# Patient Record
Sex: Male | Born: 1960
Health system: Southern US, Community
[De-identification: ages and names within clinical notes are randomized; demographics above are authoritative.]

## PROBLEM LIST (undated history)

## (undated) DIAGNOSIS — E119 Type 2 diabetes mellitus without complications: Secondary | ICD-10-CM

## (undated) DIAGNOSIS — Z973 Presence of spectacles and contact lenses: Secondary | ICD-10-CM

## (undated) DIAGNOSIS — N189 Chronic kidney disease, unspecified: Secondary | ICD-10-CM

## (undated) DIAGNOSIS — I2510 Atherosclerotic heart disease of native coronary artery without angina pectoris: Secondary | ICD-10-CM

## (undated) DIAGNOSIS — E7800 Pure hypercholesterolemia, unspecified: Secondary | ICD-10-CM

## (undated) DIAGNOSIS — R351 Nocturia: Secondary | ICD-10-CM

## (undated) DIAGNOSIS — E782 Mixed hyperlipidemia: Secondary | ICD-10-CM

## (undated) DIAGNOSIS — K219 Gastro-esophageal reflux disease without esophagitis: Secondary | ICD-10-CM

## (undated) DIAGNOSIS — I10 Essential (primary) hypertension: Secondary | ICD-10-CM

## (undated) HISTORY — DX: Pure hypercholesterolemia, unspecified: E78.00

---

## 1998-02-13 HISTORY — PX: NASAL SINUS SURGERY: SHX719

## 1998-05-26 ENCOUNTER — Ambulatory Visit (HOSPITAL_BASED_OUTPATIENT_CLINIC_OR_DEPARTMENT_OTHER): Admission: RE | Admit: 1998-05-26 | Discharge: 1998-05-26 | Payer: Self-pay | Admitting: Otolaryngology

## 2010-08-13 ENCOUNTER — Emergency Department (HOSPITAL_BASED_OUTPATIENT_CLINIC_OR_DEPARTMENT_OTHER)
Admission: EM | Admit: 2010-08-13 | Discharge: 2010-08-13 | Disposition: A | Discharge status: Home / Self Care | Payer: BC Managed Care – PPO | Attending: Emergency Medicine | Admitting: Emergency Medicine

## 2010-08-13 ENCOUNTER — Emergency Department (INDEPENDENT_AMBULATORY_CARE_PROVIDER_SITE_OTHER): Payer: BC Managed Care – PPO

## 2010-08-13 DIAGNOSIS — N201 Calculus of ureter: Secondary | ICD-10-CM

## 2010-08-13 DIAGNOSIS — N1330 Unspecified hydronephrosis: Secondary | ICD-10-CM | POA: Insufficient documentation

## 2010-08-13 DIAGNOSIS — K7689 Other specified diseases of liver: Secondary | ICD-10-CM

## 2010-08-13 DIAGNOSIS — R109 Unspecified abdominal pain: Secondary | ICD-10-CM

## 2010-08-13 DIAGNOSIS — K449 Diaphragmatic hernia without obstruction or gangrene: Secondary | ICD-10-CM | POA: Insufficient documentation

## 2010-08-13 LAB — URINALYSIS, ROUTINE W REFLEX MICROSCOPIC
Ketones, ur: NEGATIVE mg/dL
Leukocytes, UA: NEGATIVE
Nitrite: NEGATIVE
Urobilinogen, UA: 0.2 mg/dL (ref 0.0–1.0)
pH: 6 (ref 5.0–8.0)

## 2011-02-09 ENCOUNTER — Encounter (HOSPITAL_COMMUNITY): Payer: Self-pay | Admitting: *Deleted

## 2011-02-10 ENCOUNTER — Encounter (HOSPITAL_COMMUNITY)
Admission: RE | Disposition: A | Discharge status: Home / Self Care | Payer: Self-pay | Source: Ambulatory Visit | Attending: Gastroenterology

## 2011-02-10 ENCOUNTER — Ambulatory Visit (HOSPITAL_COMMUNITY): Payer: BC Managed Care – PPO

## 2011-02-10 ENCOUNTER — Other Ambulatory Visit (HOSPITAL_COMMUNITY): Payer: Self-pay | Admitting: Gastroenterology

## 2011-02-10 ENCOUNTER — Encounter (HOSPITAL_COMMUNITY): Payer: Self-pay

## 2011-02-10 ENCOUNTER — Ambulatory Visit (HOSPITAL_COMMUNITY)
Admission: RE | Admit: 2011-02-10 | Discharge: 2011-02-10 | Disposition: A | Discharge status: Home / Self Care | Payer: BC Managed Care – PPO | Source: Ambulatory Visit | Attending: Gastroenterology | Admitting: Gastroenterology

## 2011-02-10 DIAGNOSIS — K2960 Other gastritis without bleeding: Secondary | ICD-10-CM | POA: Insufficient documentation

## 2011-02-10 DIAGNOSIS — K449 Diaphragmatic hernia without obstruction or gangrene: Secondary | ICD-10-CM | POA: Insufficient documentation

## 2011-02-10 DIAGNOSIS — R1310 Dysphagia, unspecified: Secondary | ICD-10-CM | POA: Insufficient documentation

## 2011-02-10 HISTORY — PX: ESOPHAGOGASTRODUODENOSCOPY: SHX5428

## 2011-02-10 HISTORY — PX: SAVORY DILATION: SHX5439

## 2011-02-10 HISTORY — DX: Chronic kidney disease, unspecified: N18.9

## 2011-02-10 SURGERY — EGD (ESOPHAGOGASTRODUODENOSCOPY)
Anesthesia: Moderate Sedation

## 2011-02-10 MED ORDER — FENTANYL NICU IV SYRINGE 50 MCG/ML
INJECTION | INTRAMUSCULAR | Status: DC | PRN
  Administered 2011-02-10 (×2): 25 ug via INTRAVENOUS

## 2011-02-10 MED ORDER — FENTANYL CITRATE 0.05 MG/ML IJ SOLN
INTRAMUSCULAR | Status: AC
  Filled 2011-02-10: qty 4

## 2011-02-10 MED ORDER — SODIUM CHLORIDE 0.9 % IV SOLN
Freq: Once | INTRAVENOUS | Status: AC
  Administered 2011-02-10: 500 mL via INTRAVENOUS

## 2011-02-10 MED ORDER — MIDAZOLAM HCL 10 MG/2ML IJ SOLN
INTRAMUSCULAR | Status: AC
  Filled 2011-02-10: qty 4

## 2011-02-10 MED ORDER — BUTAMBEN-TETRACAINE-BENZOCAINE 2-2-14 % EX AERO
INHALATION_SPRAY | CUTANEOUS | Status: DC | PRN
  Administered 2011-02-10: 2 via TOPICAL

## 2011-02-10 MED ORDER — DIPHENHYDRAMINE HCL 50 MG/ML IJ SOLN
INTRAMUSCULAR | Status: AC
  Filled 2011-02-10: qty 1

## 2011-02-10 MED ORDER — MIDAZOLAM HCL 10 MG/2ML IJ SOLN
INTRAMUSCULAR | Status: DC | PRN
  Administered 2011-02-10 (×3): 2 mg via INTRAVENOUS

## 2011-02-10 NOTE — Op Note (Signed)
[facility] [address] [place], [place]  [address]  ENDOSCOPY PROCEDURE REPORT  PATIENT:  Zachary Hamilton, Zachary Hamilton  MR#:  [id number] BIRTHDATE:  08-18-1960, 50 yrs. old  GENDER:  male  ENDOSCOPIST:  Vida Rigger, MD Referred by:       c.  Miguel Aschoff, M.D.  PROCEDURE DATE:  02/10/2011 PROCEDURE:  EGD with dilatation over guidewire ASA CLASS:  Class II INDICATIONS:  dysphasia  MEDICATIONS:  50 mcg fentanyl 6 mg Versed TOPICAL ANESTHETIC: Used  DESCRIPTION OF PROCEDURE:   After the risks benefits and alternatives of the procedure were thoroughly explained, informed consent was obtained.  The EG-[id number] ([id number]) endoscope was introduced through the mouth and advanced to the second portion of the duodenum. The instrument was slowly withdrawn as the mucosa was fully examined. See findings below we then readvanced to the antrum the Savary wire was advanced and the customary J. loop was confirmed in the antrum under both endoscopic and fluoroscopy guidance. Under fluoroscopy guidance the scope was removed making sure to keep the wire in the proper location in the antrum and a Savary 16 mm dilator was advanced and confirmed in the proper position in the stomach there was no resistance in passing the dilator the wire was withdrawn back into the dilator and both were removed in tandem. There was no heme on the dilator the patient tolerated the procedure well there was no obvious immediate complication <<PROCEDUREIMAGES>>  FINDINGS: 1. Small hiatal hernia with minimal widely patent fibrous ring 2. Mild antritis 3. Otherwise within normal limits EGD Therapy: Savary dilatation under fluoroscopy to 16 mm only  COMPLICATIONS: None  ENDOSCOPIC IMPRESSION: Above  RECOMMENDATIONS: Continue pump inhibitors call when necessary followup when necessary or in 2 months  REPEAT EXAM:  As needed  ______________________________ Vida Rigger, MD  CC:  Gildardo Cranker MD  n. Rosalie DoctorVida Rigger at 02/10/2011 09:56 AM  Janace Litten, [phone]

## 2011-02-13 ENCOUNTER — Encounter (HOSPITAL_COMMUNITY): Payer: Self-pay | Admitting: Gastroenterology

## 2011-02-14 DIAGNOSIS — I2510 Atherosclerotic heart disease of native coronary artery without angina pectoris: Secondary | ICD-10-CM

## 2011-02-14 HISTORY — DX: Atherosclerotic heart disease of native coronary artery without angina pectoris: I25.10

## 2011-08-31 ENCOUNTER — Encounter (HOSPITAL_BASED_OUTPATIENT_CLINIC_OR_DEPARTMENT_OTHER): Payer: Self-pay | Admitting: *Deleted

## 2011-08-31 ENCOUNTER — Observation Stay (HOSPITAL_BASED_OUTPATIENT_CLINIC_OR_DEPARTMENT_OTHER)
Admission: EM | Admit: 2011-08-31 | Discharge: 2011-09-01 | Disposition: A | Discharge status: Home / Self Care | Payer: BC Managed Care – PPO | Attending: Cardiology | Admitting: Cardiology

## 2011-08-31 ENCOUNTER — Emergency Department (HOSPITAL_BASED_OUTPATIENT_CLINIC_OR_DEPARTMENT_OTHER): Payer: BC Managed Care – PPO

## 2011-08-31 DIAGNOSIS — N189 Chronic kidney disease, unspecified: Secondary | ICD-10-CM | POA: Insufficient documentation

## 2011-08-31 DIAGNOSIS — E785 Hyperlipidemia, unspecified: Secondary | ICD-10-CM | POA: Diagnosis present

## 2011-08-31 DIAGNOSIS — R0789 Other chest pain: Principal | ICD-10-CM | POA: Insufficient documentation

## 2011-08-31 DIAGNOSIS — Z8249 Family history of ischemic heart disease and other diseases of the circulatory system: Secondary | ICD-10-CM

## 2011-08-31 DIAGNOSIS — K219 Gastro-esophageal reflux disease without esophagitis: Secondary | ICD-10-CM | POA: Diagnosis present

## 2011-08-31 DIAGNOSIS — R079 Chest pain, unspecified: Secondary | ICD-10-CM | POA: Diagnosis present

## 2011-08-31 DIAGNOSIS — R1310 Dysphagia, unspecified: Secondary | ICD-10-CM | POA: Insufficient documentation

## 2011-08-31 LAB — COMPREHENSIVE METABOLIC PANEL
AST: 16 U/L (ref 0–37)
Albumin: 4.5 g/dL (ref 3.5–5.2)
Alkaline Phosphatase: 37 U/L — ABNORMAL LOW (ref 39–117)
Chloride: 99 mEq/L (ref 96–112)
Potassium: 3.6 mEq/L (ref 3.5–5.1)
Sodium: 136 mEq/L (ref 135–145)
Total Bilirubin: 0.3 mg/dL (ref 0.3–1.2)
Total Protein: 8.3 g/dL (ref 6.0–8.3)

## 2011-08-31 LAB — CBC WITH DIFFERENTIAL/PLATELET
Basophils Absolute: 0 10*3/uL (ref 0.0–0.1)
Basophils Relative: 0 % (ref 0–1)
Hemoglobin: 14.5 g/dL (ref 13.0–17.0)
MCHC: 34.6 g/dL (ref 30.0–36.0)
Neutro Abs: 5.4 10*3/uL (ref 1.7–7.7)
Neutrophils Relative %: 72 % (ref 43–77)
Platelets: 248 10*3/uL (ref 150–400)
RDW: 13.7 % (ref 11.5–15.5)

## 2011-08-31 MED ORDER — NITROGLYCERIN 0.4 MG SL SUBL
0.4000 mg | SUBLINGUAL_TABLET | SUBLINGUAL | Status: AC | PRN
  Administered 2011-08-31 (×3): 0.4 mg via SUBLINGUAL
  Filled 2011-08-31: qty 25

## 2011-08-31 NOTE — ED Notes (Signed)
Sharp left chest pain since last night. Dizziness.

## 2011-08-31 NOTE — ED Provider Notes (Signed)
History     CSN: [id number]  Arrival date & time 08/31/11  1504   First MD Initiated Contact with Patient 08/31/11 1615    Room 2, hp  Chief Complaint  Patient presents with  . Chest Pain    (Consider location/radiation/quality/duration/timing/severity/associated sxs/prior treatment) HPI Patient with chest pain across front of chest yesterday described as tight all day yesterday at 4-5/10 still present when he went to sleep.  Patient awoke and had sharper pain in left chest.  Not had any similar pain in past.  Some lightheadedness and questionably sweaty yesterday- air conditioner was out.  Today pain coming and going without inciting factors, does not occur with exertion.  Currently present at 5-6/10.  Took aspirin today.  History of chronic heart burn and required dilation.  PMD Duane Lope.   Past Medical History  Diagnosis Date  . Dysphagia   . Chronic kidney disease     Past Surgical History  Procedure Date  . Nasal sinus surgery 2000  . Esophagogastroduodenoscopy 02/10/2011    Procedure: ESOPHAGOGASTRODUODENOSCOPY (EGD);  Surgeon: Petra Kuba, MD;  Location: [facility];  Service: Endoscopy;  Laterality: N/A;  . Savory dilation 02/10/2011    Procedure: SAVORY DILATION;  Surgeon: Petra Kuba, MD;  Location: [facility];  Service: Endoscopy;  Laterality: N/A;    Family History  Problem Relation Age of Onset  . Anesthesia problems Neg Hx     History  Substance Use Topics  . Smoking status: Never Smoker   . Smokeless tobacco: Never Used  . Alcohol Use: 0.6 oz/week    1 Cans of beer per week   Father with mi-first at age 40. Patient saw cardiologist 8-9 years ago and had stress test.    Review of Systems  All other systems reviewed and are negative.    Allergies  Review of patient's allergies indicates no known allergies.  Home Medications  No current outpatient prescriptions on file.  BP 172/89  Pulse 78  Temp 98 F (36.7 C) (Oral)  Resp 22  SpO2  100%  Physical Exam  Nursing note and vitals reviewed. Constitutional: He is oriented to person, place, and time. He appears well-developed and well-nourished.  HENT:  Head: Normocephalic and atraumatic.  Right Ear: External ear normal.  Left Ear: External ear normal.  Nose: Nose normal.  Mouth/Throat: Oropharynx is clear and moist.  Eyes: Conjunctivae and EOM are normal. Pupils are equal, round, and reactive to light.  Neck: Normal range of motion. Neck supple.  Cardiovascular: Normal rate, regular rhythm, normal heart sounds and intact distal pulses.   Pulmonary/Chest: Effort normal and breath sounds normal.  Abdominal: Soft. Bowel sounds are normal.  Musculoskeletal: Normal range of motion.  Neurological: He is alert and oriented to person, place, and time. He has normal reflexes.  Skin: Skin is warm and dry.  Psychiatric: He has a normal mood and affect. His behavior is normal. Thought content normal.    ED Course  Procedures (including critical care time)  Labs Reviewed - No data to display No results found.   No diagnosis found.  Date: 08/31/2011  Rate: 73  Rhythm: normal sinus rhythm  QRS Axis: normal  Intervals: normal  ST/T Wave abnormalities: nonspecific ST changes  Conduction Disutrbances:poor r wave progression  Narrative Interpretation:   Old EKG Reviewed: none available    MDM  Patient pain free after nitro x 2, then recurred and nitro given with pain resolution.  Patient received aspirin.  Patient's care discussed  with Dr. Tresa Endo and patient to be admitted to cardiology to telemetry bed.         Hilario Quarry, MD 08/31/11 2018

## 2011-09-01 DIAGNOSIS — K219 Gastro-esophageal reflux disease without esophagitis: Secondary | ICD-10-CM | POA: Diagnosis present

## 2011-09-01 DIAGNOSIS — R079 Chest pain, unspecified: Secondary | ICD-10-CM

## 2011-09-01 DIAGNOSIS — E785 Hyperlipidemia, unspecified: Secondary | ICD-10-CM | POA: Diagnosis present

## 2011-09-01 DIAGNOSIS — Z8249 Family history of ischemic heart disease and other diseases of the circulatory system: Secondary | ICD-10-CM

## 2011-09-01 LAB — PRO B NATRIURETIC PEPTIDE: Pro B Natriuretic peptide (BNP): 23.5 pg/mL (ref 0–125)

## 2011-09-01 LAB — CBC
HCT: 41 % (ref 39.0–52.0)
Platelets: 211 10*3/uL (ref 150–400)
RDW: 14 % (ref 11.5–15.5)
WBC: 6.7 10*3/uL (ref 4.0–10.5)

## 2011-09-01 LAB — CARDIAC PANEL(CRET KIN+CKTOT+MB+TROPI)
CK, MB: 1.5 ng/mL (ref 0.3–4.0)
Relative Index: INVALID (ref 0.0–2.5)
Total CK: 87 U/L (ref 7–232)
Total CK: 86 U/L (ref 7–232)
Troponin I: <0.3 ng/mL (ref <0.30)
Troponin I: <0.3 ng/mL

## 2011-09-01 LAB — LIPID PANEL
HDL: 46 mg/dL (ref >39)
LDL Cholesterol: 151 mg/dL — ABNORMAL HIGH (ref 0–99)
Total CHOL/HDL Ratio: 4.7 RATIO

## 2011-09-01 LAB — CREATININE, SERUM
GFR calc Af Amer: >90 mL/min (ref >90)
GFR calc non Af Amer: >90 mL/min (ref >90)

## 2011-09-01 LAB — PROTIME-INR: INR: 1.03 (ref 0.00–1.49)

## 2011-09-01 LAB — APTT: aPTT: 35 s (ref 24–37)

## 2011-09-01 LAB — HEMOGLOBIN A1C
Hgb A1c MFr Bld: 5.6 %
Mean Plasma Glucose: 114 mg/dL

## 2011-09-01 MED ORDER — ASPIRIN 81 MG PO TBEC: 81.0000 mg | DELAYED_RELEASE_TABLET | Freq: Every day | ORAL | Status: AC

## 2011-09-01 MED ORDER — HEPARIN SODIUM (PORCINE) 5000 UNIT/ML IJ SOLN
5000.0000 [IU] | Freq: Three times a day (TID) | INTRAMUSCULAR | Status: DC
  Filled 2011-09-01: qty 1

## 2011-09-01 MED ORDER — SIMVASTATIN 40 MG PO TABS
40.0000 mg | ORAL_TABLET | Freq: Every day | ORAL | Status: DC
  Filled 2011-09-01 (×2): qty 1

## 2011-09-01 MED ORDER — ACETAMINOPHEN 325 MG PO TABS: 650.0000 mg | ORAL_TABLET | ORAL | Status: DC | PRN

## 2011-09-01 MED ORDER — SODIUM CHLORIDE 0.9 % IJ SOLN: 3.0000 mL | Freq: Two times a day (BID) | INTRAMUSCULAR | Status: DC

## 2011-09-01 MED ORDER — ONDANSETRON HCL 4 MG/2ML IJ SOLN: 4.0000 mg | Freq: Four times a day (QID) | INTRAMUSCULAR | Status: DC | PRN

## 2011-09-01 MED ORDER — PANTOPRAZOLE SODIUM 40 MG PO TBEC: 40.0000 mg | DELAYED_RELEASE_TABLET | Freq: Every day | ORAL | Status: DC

## 2011-09-01 MED ORDER — NITROGLYCERIN 0.4 MG SL SUBL: 0.4000 mg | SUBLINGUAL_TABLET | SUBLINGUAL | Status: DC | PRN

## 2011-09-01 MED ORDER — SODIUM CHLORIDE 0.9 % IJ SOLN: 3.0000 mL | INTRAMUSCULAR | Status: DC | PRN

## 2011-09-01 MED ORDER — SODIUM CHLORIDE 0.9 % IV SOLN: 250.0000 mL | INTRAVENOUS | Status: DC | PRN

## 2011-09-01 MED ORDER — ASPIRIN EC 81 MG PO TBEC: 81.0000 mg | DELAYED_RELEASE_TABLET | Freq: Every day | ORAL | Status: DC

## 2011-09-01 MED ORDER — SIMVASTATIN 40 MG PO TABS: 40.0000 mg | ORAL_TABLET | Freq: Every day | ORAL | Status: DC

## 2011-09-01 NOTE — Discharge Summary (Signed)
Patient ID: Zachary Hamilton. MRN: [id number] DOB/AGE: 09-06-1960 51 y.o.  Admit date: 08/31/2011 Discharge date: 09/01/2011  Primary Discharge Diagnosis: Atypical chest pain Secondary Discharge Diagnosis: Hyperlipidemia, GERD, strong family history of early coronary disease, father with myocardial infarction at age 49, GERD, prior esophageal dilatation 12/12  Significant Diagnostic Studies: EKG shows sinus rhythm/sinus bradycardia with nonspecific T-wave flattening in lead 3.  Hospital Course: 51 year old male with history of hyperlipidemia, GERD, early family history of CAD who presented to the hospital with sharp left-sided chest discomfort that was off and on most of the day yesterday concerning to him. He's had some recent friend/coworkers have had coronary disease/myocardial infarctions. His father had his first heart attack at age 48 died age 41 but was a heavy smoker. He has not experienced any exertional angina. His chest pain was occurring at rest and did not appear to be exacerbated by any activity. He has not had any recent cough, fevers, orthopnea, rashes. This morning he states that he feels better and has not had an episode of pain. He's had GI disease in the past with GERD, esophageal dilatation by Dr. Ewing Schlein.  With his family history, atypical chest pain I would like to pursue stress testing. We will set this up as an outpatient. I have given him my card to call me if any symptoms worsen. LDL cholesterol 151, HDL 46, triglycerides 101. Troponin normal, hemoglobin 14.    Discharge Exam: Blood pressure 131/80, pulse 54, temperature 98.7 F (37.1 C), temperature source Tympanic, resp. rate 18, height 6' (1.829 m), weight 100.5 kg (221 lb 9 oz), SpO2 97.00%.    General: Alert and oriented x3 in no acute distress Cardiovascular: Regular rate and rhythm, no rubs appreciated, no murmurs Lungs: Clear to auscultation bilaterally Abdomen: Soft nontender normal active bowel sounds. No  epigastric tenderness. Extremities: No clubbing cyanosis or edema, normal distal pulses, ambulating well  Labs:   Lab Results  Component Value Date   WBC 6.7 09/01/2011   HGB 14.0 09/01/2011   HCT 41.0 09/01/2011   MCV 87.0 09/01/2011   PLT 211 09/01/2011    Lab 09/01/11 0038 08/31/11 1627  NA -- 136  K -- 3.6  CL -- 99  CO2 -- 25  BUN -- 20  CREATININE 0.86 --  CALCIUM -- 9.4  PROT -- 8.3  BILITOT -- 0.3  ALKPHOS -- 37*  ALT -- 19  AST -- 16  GLUCOSE -- 107*   Lab Results  Component Value Date   CKTOTAL 86 09/01/2011   CKMB 1.5 09/01/2011   TROPONINI <0.30 09/01/2011    Lab Results  Component Value Date   CHOL 217* 09/01/2011   Lab Results  Component Value Date   HDL 46 09/01/2011   Lab Results  Component Value Date   LDLCALC 151* 09/01/2011   Lab Results  Component Value Date   TRIG 101 09/01/2011   Lab Results  Component Value Date   CHOLHDL 4.7 09/01/2011   No results found for this basename: LDLDIRECT     FOLLOW UP PLANS AND APPOINTMENTS Discharge Orders    Future Orders Please Complete By Expires   Diet - low sodium heart healthy      Increase activity slowly        Medication List  As of 09/01/2011  8:35 AM   TAKE these medications         aspirin 81 MG EC tablet   Take 1 tablet (81 mg total) by mouth daily.  nitroGLYCERIN 0.4 MG SL tablet   Commonly known as: NITROSTAT   Place 1 tablet (0.4 mg total) under the tongue every 5 (five) minutes x 3 doses as needed for chest pain.      simvastatin 40 MG tablet   Commonly known as: ZOCOR   Take 1 tablet (40 mg total) by mouth daily at 6 PM.           Follow-up Information    Follow up with Donato Schultz, MD on 09/05/2011. (Stress test Tues (office will call))    Contact information:   [address] [place] [address] [phone]          BRING ALL MEDICATIONS WITH YOU TO FOLLOW UP APPOINTMENTS  Time spent with patient to include physician time: 35 min  discussion with patient, review of medical records, review of history, coordination of stress test with office. SignedDonato Schultz 09/01/2011, 8:35 AM

## 2011-09-01 NOTE — H&P (Signed)
Zachary Worlds. is an 51 y.o. male.   Chief Complaint: chest pain Previously seen by [facility] - unknown physician name  HPI:51 yo man with PMH of HLD and GERD, never tobacco user with esophageal dilatation 12/12 who has had 1-2 days of intermittent chest pain. He tells me the pain woke him up from sleep 1-2 mornings ago and has come and go typically lasting several minutes before going away on its own. No associated neck/jaw pain or arm numbness/weakness. No association to exertion. 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 His father had 5 MIs from age 38 to 45 finally dying after his 6th MI. He tells me he did not have chest pain with his dysphagia in December. Zachary Hamilton can walk in excess of 2 miles and several flights of stairs without difficulty. No PND, no orthopnea. No DOE and no SOB.   Past Medical History  Diagnosis Date  . Dysphagia   . Chronic kidney disease     Past Surgical History  Procedure Date  . Nasal sinus surgery 2000  . Esophagogastroduodenoscopy 02/10/2011    Procedure: ESOPHAGOGASTRODUODENOSCOPY (EGD);  Surgeon: Marc E Magod, MD;  Location: [facility];  Service: Endoscopy;  Laterality: N/A;  . Savory dilation 02/10/2011    Procedure: SAVORY DILATION;  Surgeon: Marc E Magod, MD;  Location: [facility];  Service: Endoscopy;  Laterality: N/A;    Family History  Problem Relation Age of Onset  . Anesthesia problems Neg Hx    Social History:  reports that he has never smoked. He has never used smokeless tobacco. He reports that he drinks about .6 ounces of alcohol per week. He reports that he does not use illicit drugs. 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 Negative for fever, chills and malaise/fatigue.       40  lb intentional weight loss  HENT: Negative for hearing loss and neck pain.   Eyes: Negative for blurred vision and double vision.  Respiratory: Negative for cough, hemoptysis, sputum production and shortness of breath.   Cardiovascular: Positive for chest pain. Negative for palpitations, orthopnea, claudication, leg swelling and PND.  Gastrointestinal: Negative for heartburn, nausea, vomiting and abdominal pain.  Genitourinary: Negative for dysuria, urgency and frequency.  Musculoskeletal: Negative for myalgias and back pain.  Skin: Negative for itching and rash.  Neurological: Positive for dizziness. Negative for tingling, tremors, sensory change and headaches.  Endo/Heme/Allergies: Negative for environmental allergies. Does not bruise/bleed easily.  Psychiatric/Behavioral: Negative for depression, suicidal ideas and substance abuse.  All other systems reviewed and are negative.    Blood pressure 131/81, pulse 60, temperature 98 F (36.7 C), temperature source Oral, resp. rate 18, height 6' (1.829 m), weight 100.5 kg (221 lb 9 oz), SpO2 96.00%. Physical Exam  Nursing note and vitals reviewed. Constitutional: He is oriented to person, place, and time. He appears well-developed and well-nourished. No distress.       Pleasant well groomed man in NAD, fully conversant  HENT:  Head: Normocephalic and atraumatic.  Nose: Nose normal.  Mouth/Throat: Oropharynx is clear and moist. No oropharyngeal exudate.  Eyes: Conjunctivae and EOM are normal. Pupils are equal, round,  and reactive to light. No scleral icterus.  Neck: Normal range of motion. Neck supple. No JVD present. No tracheal deviation present. No thyromegaly present.  Cardiovascular: Normal rate, regular rhythm, normal heart sounds and intact distal pulses.  Exam reveals no gallop and no friction rub.   No murmur  heard. Respiratory: Effort normal and breath sounds normal. No respiratory distress. He has no wheezes. He has no rales.  GI: Soft. Bowel sounds are normal. He exhibits no distension. There is no tenderness. There is no rebound.  Musculoskeletal: Normal range of motion. He exhibits no edema and no tenderness.  Neurological: He is alert and oriented to person, place, and time. No cranial nerve deficit.  Skin: Skin is warm and dry. No rash noted. He is not diaphoretic. No erythema.  Psychiatric: He has a normal mood and affect. His behavior is normal.    Labs reviewed;  H/h 14/41, plt 248, K 3.6, na 136, bun/cr 20/0.8, troponin negative <0.3, LFTS unrevealing EKG reviewed; NSR, poor R wave progression anteriorly across precordium Chest x-ray reviewed; no overt process Assessment/Plan Problem List Chest Pain GERD Hyperlipidemia   51 yo man never tobacco user with strong family history first degree relative, negative exercise treadmill 10 years ago here with atypical chest pain. Differential is musculoskeletal, pericarditis, pleurisy, PNA, GERD and ACS. Given no exercise limitations and chest pain unrelated to exertion, EKG without signs of pericarditis and no overt signs of infection, I favor a diagnosis GERD or musculoskeletal pain. However, given significant family history, I prefer stress testing tomorrow AM if his biomarkers remain negative with Stress Echo or MPI.  - Telemetry, trend troponins - hba1c, thyroid studies, lipid panel - restart statin - simva 40 mg initially pending lipid level - defer Stress imaging order placement to AM - aspirin 81 mg daily - ambulatory so no VTE prophylaxis at this time - restart PPI  - continued diet and exercise counseling provided/reinforced  Meridith Romick 09/01/2011, 1:30 AM

## 2011-09-01 NOTE — Progress Notes (Signed)
Card  CARDIAC REHAB PHASE I   PRE:  Rate/Rhythm: 64 SR  BP:  Supine: 20/64  Sitting:   Standing:    SaO2: 98 RA  MODE:  Ambulation: 1240 ft   POST:  Rate/Rhythem: 71  BP:  Supine:   Sitting: 132/64  Standing:    SaO2: 99 RA 0945-1045  Tolerated ambulation well without c/o of cp or SOB. VS stable. Completed discharge education with pt and wife. Pt voices understanding.  Beatrix Fetters

## 2011-09-02 ENCOUNTER — Encounter (HOSPITAL_COMMUNITY): Payer: Self-pay | Admitting: Emergency Medicine

## 2011-09-02 ENCOUNTER — Emergency Department (HOSPITAL_COMMUNITY): Payer: BC Managed Care – PPO

## 2011-09-02 ENCOUNTER — Observation Stay (HOSPITAL_COMMUNITY)
Admission: EM | Admit: 2011-09-02 | Discharge: 2011-09-03 | Disposition: A | Discharge status: Home / Self Care | Payer: BC Managed Care – PPO | Attending: Emergency Medicine | Admitting: Emergency Medicine

## 2011-09-02 DIAGNOSIS — I2510 Atherosclerotic heart disease of native coronary artery without angina pectoris: Secondary | ICD-10-CM | POA: Insufficient documentation

## 2011-09-02 DIAGNOSIS — K449 Diaphragmatic hernia without obstruction or gangrene: Secondary | ICD-10-CM | POA: Insufficient documentation

## 2011-09-02 DIAGNOSIS — K219 Gastro-esophageal reflux disease without esophagitis: Secondary | ICD-10-CM | POA: Insufficient documentation

## 2011-09-02 DIAGNOSIS — N189 Chronic kidney disease, unspecified: Secondary | ICD-10-CM | POA: Insufficient documentation

## 2011-09-02 DIAGNOSIS — R079 Chest pain, unspecified: Principal | ICD-10-CM | POA: Insufficient documentation

## 2011-09-02 DIAGNOSIS — R911 Solitary pulmonary nodule: Secondary | ICD-10-CM | POA: Insufficient documentation

## 2011-09-02 HISTORY — DX: Gastro-esophageal reflux disease without esophagitis: K21.9

## 2011-09-02 LAB — CBC WITH DIFFERENTIAL/PLATELET
Basophils Absolute: 0 10*3/uL (ref 0.0–0.1)
Basophils Relative: 0 % (ref 0–1)
MCHC: 33.7 g/dL (ref 30.0–36.0)
Neutro Abs: 3.4 10*3/uL (ref 1.7–7.7)
Neutrophils Relative %: 55 % (ref 43–77)
Platelets: 248 10*3/uL (ref 150–400)
RDW: 13.7 % (ref 11.5–15.5)

## 2011-09-02 LAB — COMPREHENSIVE METABOLIC PANEL
AST: 19 U/L (ref 0–37)
Albumin: 4.1 g/dL (ref 3.5–5.2)
Alkaline Phosphatase: 38 U/L — ABNORMAL LOW (ref 39–117)
Chloride: 101 mEq/L (ref 96–112)
Potassium: 3.8 mEq/L (ref 3.5–5.1)
Total Bilirubin: 0.2 mg/dL — ABNORMAL LOW (ref 0.3–1.2)

## 2011-09-02 LAB — POCT I-STAT TROPONIN I

## 2011-09-02 IMAGING — CR DG CHEST 1V PORT
1 series · 1 of 1 positions shown · non-contrast
Comparison: [DATE]

CLINICAL DATA: Chest pain.

PORTABLE CHEST - 1 VIEW

[AP]
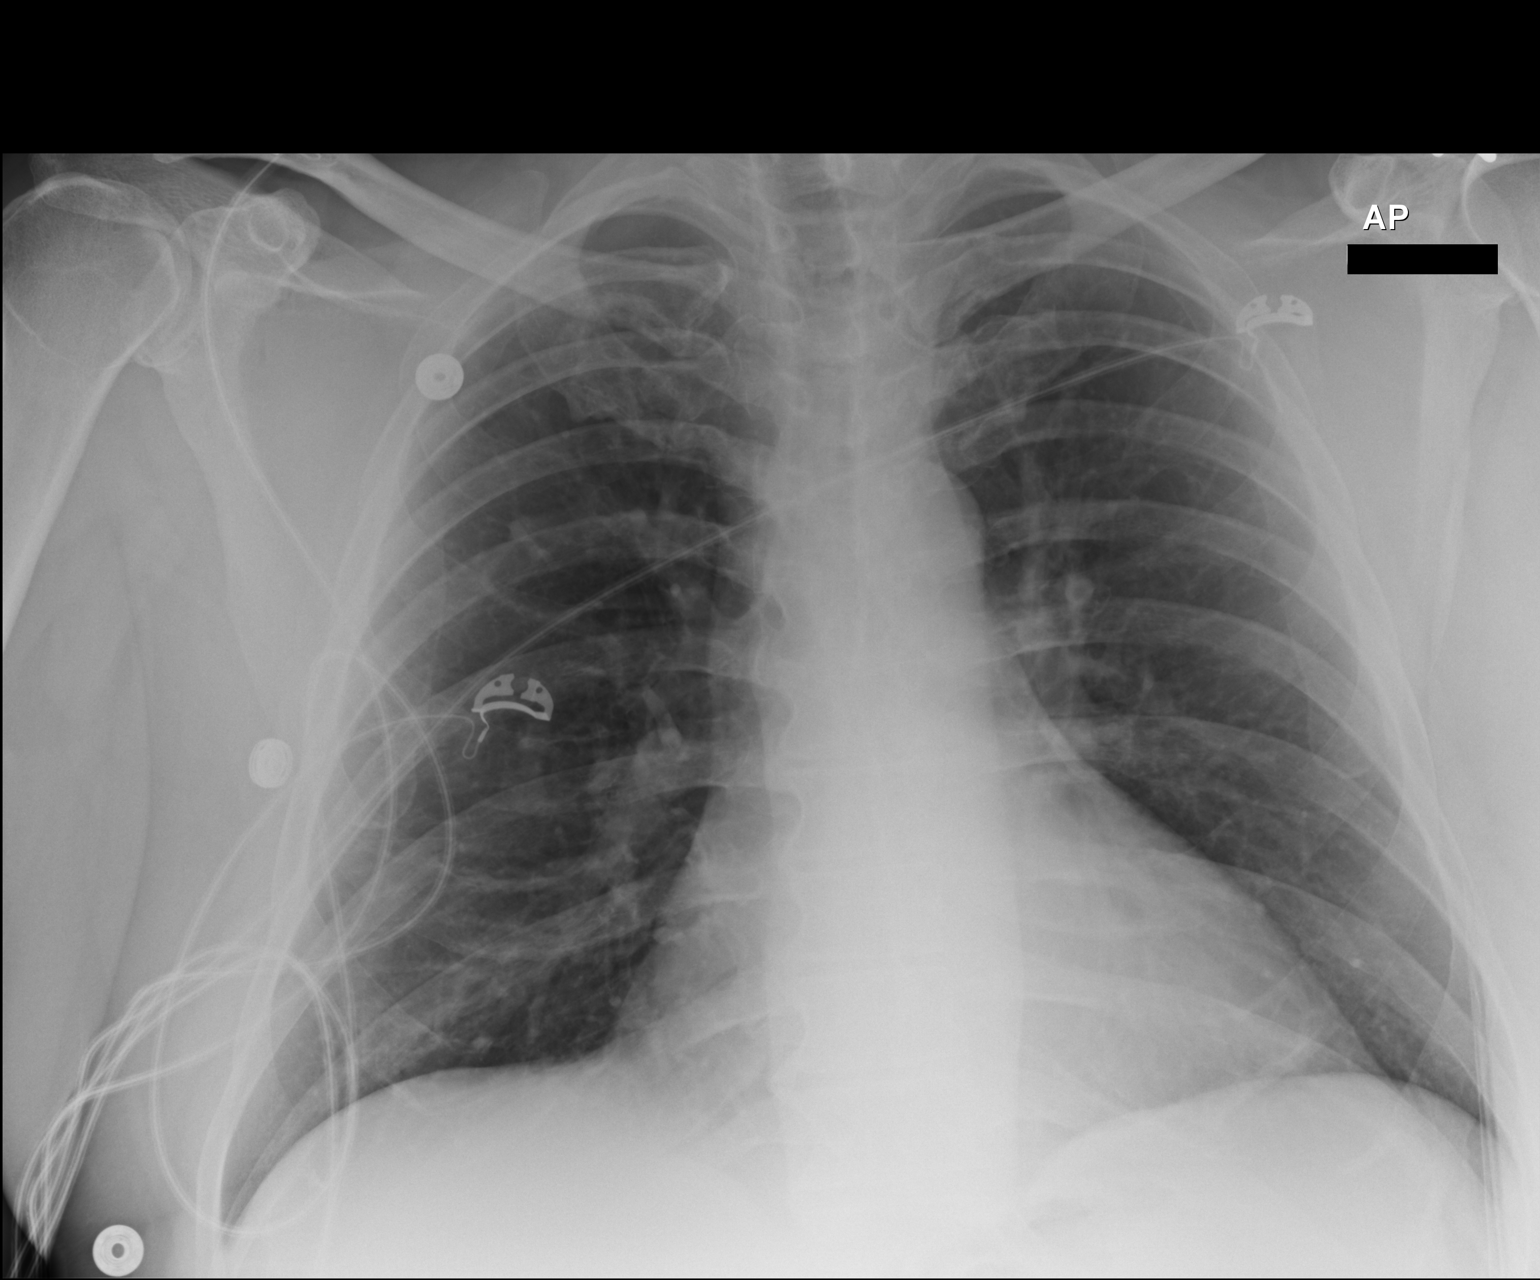

[1 of 1 positions shown; findings below may reference images not displayed]

FINDINGS: Slightly shallow inspiration. The heart size and
pulmonary vascularity are normal. The lungs appear clear and
expanded without focal air space disease or consolidation. No
blunting of the costophrenic angles.  Scattered calcified
granulomas in the lungs.  No pneumothorax.  Stable appearance since
previous study.
IMPRESSION: No evidence of active pulmonary disease.

## 2011-09-02 MED ORDER — ONDANSETRON HCL 4 MG/2ML IJ SOLN
4.0000 mg | Freq: Once | INTRAMUSCULAR | Status: AC
  Administered 2011-09-02: 4 mg via INTRAVENOUS
  Filled 2011-09-02: qty 2

## 2011-09-02 MED ORDER — GI COCKTAIL ~~LOC~~
30.0000 mL | Freq: Once | ORAL | Status: AC
  Administered 2011-09-02: 30 mL via ORAL
  Filled 2011-09-02: qty 30

## 2011-09-02 MED ORDER — ASPIRIN 81 MG PO CHEW
162.0000 mg | CHEWABLE_TABLET | Freq: Once | ORAL | Status: AC
  Administered 2011-09-02: 162 mg via ORAL
  Filled 2011-09-02: qty 2

## 2011-09-02 MED ORDER — MORPHINE SULFATE 4 MG/ML IJ SOLN
4.0000 mg | Freq: Once | INTRAMUSCULAR | Status: AC
  Administered 2011-09-02: 4 mg via INTRAVENOUS
  Filled 2011-09-02: qty 1

## 2011-09-02 NOTE — ED Provider Notes (Signed)
History     CSN: [id number]  Arrival date & time 09/02/11  2123   First MD Initiated Contact with Patient 09/02/11 2300      Chief Complaint  Patient presents with  . Chest Pain    (Consider location/radiation/quality/duration/timing/severity/associated sxs/prior treatment) Patient is a 51 y.o. male presenting with chest pain.  Chest Pain Pertinent negatives for primary symptoms include no fever, no shortness of breath and no abdominal pain.    Hx per PT, burning substernal pain, not like previous heartburn, also pressure like. No SOB, diaphoresis, no radiation of pain. Admitted this week to [facility] for CP and sent home w NTG and scheduled for stress test next week. Today restarted PPI. Was 8/10 now 2/10. Took NTG today with no relief. ASA PTA. No cough, no leg pain. Fa CAD w MI age 38. No provoking/ alleviating factors to pain. Past Medical History  Diagnosis Date  . Dysphagia   . Chronic kidney disease   . GERD (gastroesophageal reflux disease)     Past Surgical History  Procedure Date  . Nasal sinus surgery 2000  . Esophagogastroduodenoscopy 02/10/2011    Procedure: ESOPHAGOGASTRODUODENOSCOPY (EGD);  Surgeon: Petra Kuba, MD;  Location: [facility];  Service: Endoscopy;  Laterality: N/A;  . Savory dilation 02/10/2011    Procedure: SAVORY DILATION;  Surgeon: Petra Kuba, MD;  Location: [facility];  Service: Endoscopy;  Laterality: N/A;    Family History  Problem Relation Age of Onset  . Anesthesia problems Neg Hx     History  Substance Use Topics  . Smoking status: Never Smoker   . Smokeless tobacco: Never Used  . Alcohol Use: 0.6 oz/week    1 Cans of beer per week      Review of Systems  Constitutional: Negative for fever and chills.  HENT: Negative for neck pain and neck stiffness.   Eyes: Negative for pain.  Respiratory: Negative for shortness of breath.   Cardiovascular: Positive for chest pain.  Gastrointestinal: Negative for abdominal pain.    Genitourinary: Negative for dysuria.  Musculoskeletal: Negative for back pain.  Skin: Negative for rash.  Neurological: Negative for headaches.  All other systems reviewed and are negative.    Allergies  Review of patient's allergies indicates no known allergies.  Home Medications   Current Outpatient Rx  Name Route Sig Dispense Refill  . ASPIRIN 81 MG PO TBEC Oral Take 1 tablet (81 mg total) by mouth daily. 30 tablet 12  . NITROGLYCERIN 0.4 MG SL SUBL Sublingual Place 1 tablet (0.4 mg total) under the tongue every 5 (five) minutes x 3 doses as needed for chest pain. 30 tablet 12  . OMEPRAZOLE 40 MG PO CPDR Oral Take 40 mg by mouth daily.    Marland Kitchen SIMVASTATIN 40 MG PO TABS Oral Take 1 tablet (40 mg total) by mouth daily at 6 PM. 30 tablet 12    BP 133/66  Pulse 80  Temp 98.4 F (36.9 C) (Oral)  Resp 16  SpO2 98%  Physical Exam  Constitutional: He is oriented to person, place, and time. He appears well-developed and well-nourished.  HENT:  Head: Normocephalic and atraumatic.  Eyes: Conjunctivae and EOM are normal. Pupils are equal, round, and reactive to light.  Neck: Trachea normal. Neck supple. No thyromegaly present.  Cardiovascular: Normal rate, regular rhythm, S1 normal, S2 normal and normal pulses.     No systolic murmur is present   No diastolic murmur is present  Pulses:  Radial pulses are 2+ on the right side, and 2+ on the left side.  Pulmonary/Chest: Effort normal and breath sounds normal. He has no wheezes. He has no rhonchi. He has no rales. He exhibits no tenderness.  Abdominal: Soft. Normal appearance and bowel sounds are normal. There is no tenderness. There is no CVA tenderness and negative Murphy's sign.  Musculoskeletal:       BLE:s Calves nontender, no cords or erythema, negative Homans sign  Neurological: He is alert and oriented to person, place, and time. He has normal strength. No cranial nerve deficit or sensory deficit. GCS eye subscore is 4. GCS  verbal subscore is 5. GCS motor subscore is 6.  Skin: Skin is warm and dry. No rash noted. He is not diaphoretic.  Psychiatric: His speech is normal.       Cooperative and appropriate    ED Course  Procedures (including critical care time)   Results for orders placed during the hospital encounter of 09/02/11  CBC WITH DIFFERENTIAL      Component Value Range   WBC 6.2  4.0 - 10.5 K/uL   RBC 4.99  4.22 - 5.81 MIL/uL   Hemoglobin 14.6  13.0 - 17.0 g/dL   HCT 45.4  09.8 - 11.9 %   MCV 86.8  78.0 - 100.0 fL   MCH 29.3  26.0 - 34.0 pg   MCHC 33.7  30.0 - 36.0 g/dL   RDW 14.7  82.9 - 56.2 %   Platelets 248  150 - 400 K/uL   Neutrophils Relative 55  43 - 77 %   Neutro Abs 3.4  1.7 - 7.7 K/uL   Lymphocytes Relative 33  12 - 46 %   Lymphs Abs 2.1  0.7 - 4.0 K/uL   Monocytes Relative 9  3 - 12 %   Monocytes Absolute 0.6  0.1 - 1.0 K/uL   Eosinophils Relative 2  0 - 5 %   Eosinophils Absolute 0.1  0.0 - 0.7 K/uL   Basophils Relative 0  0 - 1 %   Basophils Absolute 0.0  0.0 - 0.1 K/uL  COMPREHENSIVE METABOLIC PANEL      Component Value Range   Sodium 138  135 - 145 mEq/L   Potassium 3.8  3.5 - 5.1 mEq/L   Chloride 101  96 - 112 mEq/L   CO2 26  19 - 32 mEq/L   Glucose, Bld 191 (*) 70 - 99 mg/dL   BUN 18  6 - 23 mg/dL   Creatinine, Ser 1.30  0.50 - 1.35 mg/dL   Calcium 9.3  8.4 - 86.5 mg/dL   Total Protein 8.0  6.0 - 8.3 g/dL   Albumin 4.1  3.5 - 5.2 g/dL   AST 19  0 - 37 U/L   ALT 20  0 - 53 U/L   Alkaline Phosphatase 38 (*) 39 - 117 U/L   Total Bilirubin 0.2 (*) 0.3 - 1.2 mg/dL   GFR calc non Af Amer >90  >90 mL/min   GFR calc Af Amer >90  >90 mL/min  POCT I-STAT TROPONIN I      Component Value Range   Troponin i, poc 0.00  0.00 - 0.08 ng/mL   Comment 3            Dg Chest 2 View  08/31/2011  *RADIOLOGY REPORT*  Clinical Data: Left-sided chest pain.  CHEST - 2 VIEW  Comparison: None.  Findings: The heart size is normal.  The lungs are  clear. Degenerative changes are present  within the thoracic spine.  IMPRESSION:  1.  No acute cardiopulmonary disease. 2.  Degenerative changes of the thoracic spine.  Original Report Authenticated By: Jamesetta Orleans. MATTERN, M.D.     Date: 09/02/2011  Rate: 81  Rhythm: normal sinus rhythm  QRS Axis: normal  Intervals: normal  ST/T Wave abnormalities: nonspecific ST changes  Conduction Disutrbances:none  Narrative Interpretation:   Old EKG Reviewed: unchanged  ASA. NTG. Morphine. Labs, ECG and CXR as above  11:40 PM d/w Dr Maryelizabeth Kaufmann, [facility], discussed options, PT wants to have further diagnostic studying and Dr Maryelizabeth Kaufmann eva;uated PT/ agrees that CCT would be appropriate substitute for stress test.  He will notify Dr Dione Housekeeper.  PT placed on CDU OBS protocol - plan Coronary Ct in am   MDM   Nursing Notes reviewed.  Old records reviewed. Vital signs reviewed. Labs, ECG and CXR reviewed.   Observation protocol initiated        Sunnie Nielsen, MD 09/03/11 [phone]

## 2011-09-02 NOTE — ED Notes (Signed)
Patient was seen here Thursday for chest pain.  Patient has scheduled stress test on Tuesday.  Patient states that he began to have chest pain again tonight; took three nitro with little to no relief.  Denies shortness of breath, nausea, vomiting, and diaphoresis.  EKG done in triage.

## 2011-09-03 ENCOUNTER — Observation Stay (HOSPITAL_COMMUNITY): Payer: BC Managed Care – PPO

## 2011-09-03 LAB — POCT I-STAT TROPONIN I: Troponin i, poc: 0 ng/mL (ref 0.00–0.08)

## 2011-09-03 LAB — GLUCOSE, CAPILLARY: Glucose-Capillary: 87 mg/dL (ref 70–99)

## 2011-09-03 MED ORDER — METOPROLOL TARTRATE 25 MG PO TABS
50.0000 mg | ORAL_TABLET | Freq: Once | ORAL | Status: AC
  Administered 2011-09-03: 50 mg via ORAL
  Filled 2011-09-03: qty 2

## 2011-09-03 MED ORDER — SODIUM CHLORIDE 0.9 % IV SOLN: 1000.0000 mL | INTRAVENOUS | Status: DC

## 2011-09-03 MED ORDER — NITROGLYCERIN 0.4 MG SL SUBL
SUBLINGUAL_TABLET | SUBLINGUAL | Status: AC
  Administered 2011-09-03: 0.4 mg
  Filled 2011-09-03: qty 25

## 2011-09-03 MED ORDER — IOHEXOL 350 MG/ML SOLN
80.0000 mL | Freq: Once | INTRAVENOUS | Status: AC | PRN
  Administered 2011-09-03: 80 mL via INTRAVENOUS

## 2011-09-03 MED ORDER — METOPROLOL TARTRATE 1 MG/ML IV SOLN
INTRAVENOUS | Status: AC
  Administered 2011-09-03: 5 mg
  Filled 2011-09-03: qty 10

## 2011-09-03 NOTE — ED Notes (Signed)
Pt. Denies any chest pain or pressure, denies any sob or dizziness, ambulated to the bathroom, gait steady, NPO maintained

## 2012-08-09 ENCOUNTER — Encounter (HOSPITAL_COMMUNITY): Payer: Self-pay | Admitting: Emergency Medicine

## 2012-08-09 ENCOUNTER — Observation Stay (HOSPITAL_COMMUNITY)
Admission: EM | Admit: 2012-08-09 | Discharge: 2012-08-12 | Disposition: A | Discharge status: Home / Self Care | Payer: BC Managed Care – PPO | Attending: Cardiology | Admitting: Cardiology

## 2012-08-09 ENCOUNTER — Emergency Department (HOSPITAL_COMMUNITY): Payer: BC Managed Care – PPO

## 2012-08-09 DIAGNOSIS — I491 Atrial premature depolarization: Secondary | ICD-10-CM | POA: Insufficient documentation

## 2012-08-09 DIAGNOSIS — I249 Acute ischemic heart disease, unspecified: Secondary | ICD-10-CM

## 2012-08-09 DIAGNOSIS — R0602 Shortness of breath: Secondary | ICD-10-CM | POA: Insufficient documentation

## 2012-08-09 DIAGNOSIS — I2510 Atherosclerotic heart disease of native coronary artery without angina pectoris: Principal | ICD-10-CM | POA: Diagnosis present

## 2012-08-09 DIAGNOSIS — I200 Unstable angina: Secondary | ICD-10-CM | POA: Insufficient documentation

## 2012-08-09 DIAGNOSIS — R079 Chest pain, unspecified: Secondary | ICD-10-CM | POA: Insufficient documentation

## 2012-08-09 DIAGNOSIS — R61 Generalized hyperhidrosis: Secondary | ICD-10-CM | POA: Insufficient documentation

## 2012-08-09 DIAGNOSIS — Z8249 Family history of ischemic heart disease and other diseases of the circulatory system: Secondary | ICD-10-CM | POA: Insufficient documentation

## 2012-08-09 DIAGNOSIS — K219 Gastro-esophageal reflux disease without esophagitis: Secondary | ICD-10-CM

## 2012-08-09 DIAGNOSIS — N189 Chronic kidney disease, unspecified: Secondary | ICD-10-CM | POA: Insufficient documentation

## 2012-08-09 DIAGNOSIS — I129 Hypertensive chronic kidney disease with stage 1 through stage 4 chronic kidney disease, or unspecified chronic kidney disease: Secondary | ICD-10-CM | POA: Insufficient documentation

## 2012-08-09 DIAGNOSIS — K449 Diaphragmatic hernia without obstruction or gangrene: Secondary | ICD-10-CM | POA: Insufficient documentation

## 2012-08-09 DIAGNOSIS — E785 Hyperlipidemia, unspecified: Secondary | ICD-10-CM | POA: Insufficient documentation

## 2012-08-09 HISTORY — DX: Atherosclerotic heart disease of native coronary artery without angina pectoris: I25.10

## 2012-08-09 LAB — BASIC METABOLIC PANEL
BUN: 18 mg/dL (ref 6–23)
CO2: 25 mEq/L (ref 19–32)
CO2: 26 mEq/L (ref 19–32)
Calcium: 8.9 mg/dL (ref 8.4–10.5)
Chloride: 100 mEq/L (ref 96–112)
Chloride: 102 mEq/L (ref 96–112)
Creatinine, Ser: 0.84 mg/dL (ref 0.50–1.35)
GFR calc non Af Amer: >90 mL/min (ref >90)
Glucose, Bld: 106 mg/dL — ABNORMAL HIGH (ref 70–99)
Glucose, Bld: 186 mg/dL — ABNORMAL HIGH (ref 70–99)
Potassium: 3.8 mEq/L (ref 3.5–5.1)
Sodium: 136 mEq/L (ref 135–145)
Sodium: 136 mEq/L (ref 135–145)

## 2012-08-09 LAB — CBC WITH DIFFERENTIAL/PLATELET
Hemoglobin: 14.4 g/dL (ref 13.0–17.0)
Lymphocytes Relative: 28 % (ref 12–46)
Lymphs Abs: 2.2 10*3/uL (ref 0.7–4.0)
MCH: 29.9 pg (ref 26.0–34.0)
Monocytes Relative: 8 % (ref 3–12)
Neutro Abs: 4.7 10*3/uL (ref 1.7–7.7)
Neutrophils Relative %: 61 % (ref 43–77)
Platelets: 226 10*3/uL (ref 150–400)
RBC: 4.82 MIL/uL (ref 4.22–5.81)
WBC: 7.7 10*3/uL (ref 4.0–10.5)

## 2012-08-09 LAB — POCT I-STAT TROPONIN I: Troponin i, poc: 0 ng/mL (ref 0.00–0.08)

## 2012-08-09 LAB — PROTIME-INR: Prothrombin Time: 12.9 seconds (ref 11.6–15.2)

## 2012-08-09 MED ORDER — NITROGLYCERIN 2 % TD OINT
1.0000 [in_us] | TOPICAL_OINTMENT | Freq: Once | TRANSDERMAL | Status: AC
  Administered 2012-08-09: 1 [in_us] via TOPICAL
  Filled 2012-08-09: qty 1

## 2012-08-09 MED ORDER — ATORVASTATIN CALCIUM 40 MG PO TABS
40.0000 mg | ORAL_TABLET | Freq: Every day | ORAL | Status: DC
  Administered 2012-08-10 – 2012-08-11 (×2): 40 mg via ORAL
  Filled 2012-08-09 (×3): qty 1

## 2012-08-09 MED ORDER — NITROGLYCERIN 0.4 MG SL SUBL
0.4000 mg | SUBLINGUAL_TABLET | SUBLINGUAL | Status: DC | PRN
  Administered 2012-08-09: 0.4 mg via SUBLINGUAL

## 2012-08-09 MED ORDER — SODIUM CHLORIDE 0.9 % IJ SOLN
3.0000 mL | Freq: Two times a day (BID) | INTRAMUSCULAR | Status: DC
  Administered 2012-08-10 – 2012-08-11 (×2): 3 mL via INTRAVENOUS

## 2012-08-09 MED ORDER — NITROGLYCERIN 2 % TD OINT
1.0000 [in_us] | TOPICAL_OINTMENT | Freq: Four times a day (QID) | TRANSDERMAL | Status: DC
  Administered 2012-08-10 – 2012-08-12 (×9): 1 [in_us] via TOPICAL
  Filled 2012-08-09: qty 30

## 2012-08-09 MED ORDER — SODIUM CHLORIDE 0.9 % IJ SOLN: 3.0000 mL | INTRAMUSCULAR | Status: DC | PRN

## 2012-08-09 MED ORDER — ONDANSETRON HCL 4 MG/2ML IJ SOLN: 4.0000 mg | Freq: Four times a day (QID) | INTRAMUSCULAR | Status: DC | PRN

## 2012-08-09 MED ORDER — NITROGLYCERIN 0.4 MG SL SUBL: 0.4000 mg | SUBLINGUAL_TABLET | SUBLINGUAL | Status: DC | PRN

## 2012-08-09 MED ORDER — ASPIRIN EC 81 MG PO TBEC
81.0000 mg | DELAYED_RELEASE_TABLET | Freq: Every day | ORAL | Status: DC
  Administered 2012-08-10 – 2012-08-11 (×2): 81 mg via ORAL
  Filled 2012-08-09 (×3): qty 1

## 2012-08-09 MED ORDER — ACETAMINOPHEN 325 MG PO TABS
650.0000 mg | ORAL_TABLET | ORAL | Status: DC | PRN
  Administered 2012-08-10: 650 mg via ORAL
  Filled 2012-08-09: qty 2

## 2012-08-09 MED ORDER — SODIUM CHLORIDE 0.9 % IV SOLN: 250.0000 mL | INTRAVENOUS | Status: DC | PRN

## 2012-08-09 NOTE — ED Notes (Signed)
Attempted report 

## 2012-08-09 NOTE — H&P (Signed)
CARDIOLOGY CONSULT NOTE  Patient ID: Zachary Baller., MRN: [id number], DOB/AGE: 07-10-1960 52 y.o. Admit date: 08/09/2012 Date of Consult: 08/09/2012  Primary Physician: Daisy Floro, MD Primary Cardiologist: Dr. Anne Fu  Chief Complaint: chest pain Reason for Consultation: concern for ACS  HPI: 52 yo male with PMH sig for nonobstructive CAD by CT angio 08/2011, pre-hypertension and a strong family history of CAD presenting with a good story for cardiac chest pain. Today, he experienced chest pain that initiated in his neck and extended to his shoulders. Associated with nausea and diaphoresis and shortness of breath. Occurred at rest. Took aspirin at home and 2 nitro with the second nitro helping slightly. Additional nitro by EMS resolved chest pain. Lasted 20-30 minutes. Now completely chest pain free.   He has a history of GERD but describes this as completely different. 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 No DOE, LE edema, orthopnea.  1 year ago was admitted with similar symptoms and underwent CTA demonstrated mod LAD disease and other nonobstructive lesions, coronary calcium of 63. Outpatient nuclear MPI was negative per pt. Was started on statin at the time.  Also has left sided chest wall and shoulder tenderness that is a separate issue. He also thinks he can feel lymph nodes. No fevers, chills, weight loss. Patient thinks it may be statin myalgia.   CTA 09/03/2011 IMPRESSION:  1. Multivessel coronary artery disease, as detailed above.  Findings are predominantly of nonobstructive disease, however,  there is a very short segment eccentric noncalcified plaque in the  distal LAD with a 50-75% stenosis. Correlation with the stress  testing may be warranted to exclude a hemodynamically significant  lesion. The patient's total patient's total coronary artery calcium  score is 63 which is 84th percentile for patient's of matched  age,  gender and race  2. No other acute findings in the thorax to account for the  patient's symptoms.  3. Small hiatal hernia.   Past Medical History  Diagnosis Date  . Dysphagia       . GERD (gastroesophageal reflux disease)   Non-obstructive CAD by CTA 08/2011  Surgical History:  Past Surgical History  Procedure Laterality Date  . Nasal sinus surgery  2000  . Esophagogastroduodenoscopy  02/10/2011    Procedure: ESOPHAGOGASTRODUODENOSCOPY (EGD);  Surgeon: Petra Kuba, MD;  Location: [facility];  Service: Endoscopy;  Laterality: N/A;  . Savory dilation  02/10/2011    Procedure: SAVORY DILATION;  Surgeon: Petra Kuba, MD;  Location: [facility];  Service: Endoscopy;  Laterality: N/A;     Home Meds: Prior to Admission medications   Medication Sig Start Date End Date Taking? Authorizing Provider  aspirin 325 MG tablet Take 650 mg by mouth once.   Yes Historical Provider, MD  aspirin EC 81 MG EC tablet Take 1 tablet (81 mg total) by mouth daily. 09/01/11 08/31/12 Yes Donato Schultz, MD  Coenzyme Q10 300 MG CAPS Take 1 capsule by mouth daily.   Yes Historical Provider, MD  nitroGLYCERIN (NITROSTAT) 0.4 MG SL tablet Place 1 tablet (0.4 mg total) under the tongue every 5 (five) minutes x 3 doses as needed for chest pain. 09/01/11 08/31/12 Yes Donato Schultz, MD  simvastatin (ZOCOR) 40 MG tablet Take 1 tablet (40 mg total) by mouth daily at 6 PM. 09/01/11 08/31/12 Yes Donato Schultz, MD    Inpatient Medications:      Allergies: No Known Allergies  History  Social History  . Marital Status: Married    Spouse Name: N/A    Number of Children: N/A  . Years of Education: N/A   Occupational History  . Not on file.   Social History Main Topics  . Smoking status: Never Smoker   . Smokeless tobacco: Never Used  . Alcohol Use: 0.6 oz/week    1 Cans of beer per week  . Drug Use: No  . Sexually Active:    Other Topics Concern  . Not on file   Social History Narrative  . No narrative  on file     Family History  Problem Relation Age of Onset  . Anesthesia problems Neg Hx    +CVD- father died at 44 of MI, first MI at 92,  Extended family with +Factor V leiden deficiency  Review of Systems: General: negative for chills, fever, night sweats or weight changes.  Cardiovascular: as per HPI Dermatological: negative for rash Respiratory: negative for cough or wheezing Urologic: negative for hematuria Abdominal: negative for nausea, vomiting, diarrhea, bright red blood per rectum, melena, or hematemesis Neurologic: negative for visual changes, syncope, or dizziness All other systems reviewed and are otherwise negative except as noted above.  Labs:  Recent Labs  08/09/12 1832  CKTOTAL 195   Lab Results  Component Value Date   WBC 7.7 08/09/2012   HGB 14.4 08/09/2012   HCT 41.9 08/09/2012   MCV 86.9 08/09/2012   PLT 226 08/09/2012    Recent Labs Lab 08/09/12 1832  NA 136  K 3.8  CL 100  CO2 25  BUN 18  CREATININE 0.85  CALCIUM 9.0  GLUCOSE 106*   Lab Results  Component Value Date   CHOL 217* 09/01/2011   HDL 46 09/01/2011   LDLCALC 151* 09/01/2011   TRIG 101 09/01/2011   No results found for this basename: DDIMER    Radiology/Studies:  Dg Chest 2 View  08/09/2012   *RADIOLOGY REPORT*  Clinical Data: Left-sided chest pain radiating into the left upper extremity and jaw.  Shortness of breath.  Current history of coronary artery disease.  CHEST - 2 VIEW  Comparison: Coronary c t a 09/03/2011.  Portable chest x-ray 09/02/2011.Two-view chest x-ray 08/31/2011.  Findings: Cardiac silhouette upper normal in size, unchanged. Hilar and mediastinal contours otherwise unremarkable.  Possible nodule in the right upper lobe.  Lungs otherwise clear. Bronchovascular markings normal.  Pulmonary vascularity normal.  No pneumothorax.  No pleural effusions.  Degenerative changes involving the thoracic spine.  No significant interval change.  IMPRESSION:  1.  No acute  cardiopulmonary disease. 2.  Possible right upper lobe lung nodule.  A non-emergent CT of the chest with contrast may be beneficial in further evaluation to confirm or deny this finding.   Original Report Authenticated By: Hulan Saas, M.D.    EKG: sinus, pacs, no st or tw changes  Physical Exam: Blood pressure 132/75, pulse 77, resp. rate 16, SpO2 96.00%. General: Well developed, well nourished, in no acute distress. Head: Normocephalic, atraumatic, sclera non-icteric, no xanthomas, nares are without discharge.  Neck: Supple. Negative for carotid bruits. JVD not elevated. Lungs: Clear bilaterally to auscultation without wheezes, rales, or rhonchi. Breathing is unlabored. Heart: RRR with S1 S2. No murmurs, rubs, or gallops appreciated. Abdomen: Soft, non-tender, non-distended with normoactive bowel sounds. No hepatomegaly. No rebound/guarding. No obvious abdominal masses. Msk:  Strength and tone appear normal for age. Extremities: No clubbing or cyanosis. No edema.  Distal pedal pulses are 2+ and equal  bilaterally. Unable to apprec any lymphadenopathy Neuro: Alert and oriented X 3. Moves all extremities spontaneously. Psych:  Responds to questions appropriately with a normal affect.   1. Chest pain concerning for unstable angina, r/o ACS 2. Hyperlipidemia 3. Very strong family history 4. CAD - nonobstructive by CTA 2013 5. Hypertension - new diagnosis 6. Hiatal hernia by CT 7. Hyperlipidemia 8. Myalgias, ?statin  Assessment and Plan:  52 yo male with PMH sig for nonobstructive CAD by CT angio 08/2011, pre-hypertension and a strong family history of CAD presenting with a good story for cardiac chest pain --> now chest pain free.   Encouragingly, he has not had recurrent symptoms, is currently chest pain free and initial labs and EKG are benign. However, his story and history are concerning enough that he warrants further observation with serial enzymes and telemetry. His trajectory  overnight will determine whether cath or noninvasive risk stratification is warranted as progression of his coronary disease is certainly possible. Continue aspirin and statin at this point. Low threshold to start full anticoagulation  (if + troponin or recurrent symptoms). Alternative diagnosis could be his hiatal hernia though cardiac cause needs to be thoroughly evaluated first.  Regarding his shoulder discomfort, may be related to his simvastatin. Will switch to atorvastatin. Nl CPK.  New diagonsis of hypertension given that by history he is elevated. Will keep nitro paste on overnight but consider adding agents prior to discharge.  NPO at midnight. DVT prophy- ambulatory.  Signed, Adolm Joseph, Troy Sine MD 08/09/2012, 9:22 PM

## 2012-08-09 NOTE — ED Notes (Signed)
Pt from home c/o cp w/ sob, jaw radiation, nausea, and diaphoresis. Pt received 324 asa and 5 nitro PTA with relief.

## 2012-08-09 NOTE — ED Notes (Signed)
Pt remains painfree

## 2012-08-09 NOTE — ED Provider Notes (Signed)
History    CSN: [id number] Arrival date & time 08/09/12  7846  First MD Initiated Contact with Patient 08/09/12 1815     Chief Complaint  Patient presents with  . Chest Pain   (Consider location/radiation/quality/duration/timing/severity/associated sxs/prior Treatment) Patient is a 52 y.o. male presenting with chest pain. The history is provided by the patient.  Chest Pain Pain location:  L chest Pain quality: burning   Pain radiates to:  L jaw and L shoulder Pain radiates to the back: no   Pain severity:  Severe Onset quality:  Sudden Duration:  1 hour Timing:  Constant Progression:  Resolved Chronicity:  New Context comment:  Started while he was sitting Relieved by:  Nitroglycerin Worsened by:  Nothing tried Associated symptoms: diaphoresis, nausea and shortness of breath   Associated symptoms: no abdominal pain, no dizziness, no palpitations and not vomiting   Risk factors: high cholesterol   Risk factors: no smoking   Risk factors comment:  Significant family hx of MI  Past Medical History  Diagnosis Date  . Dysphagia   . Chronic kidney disease   . GERD (gastroesophageal reflux disease)    Past Surgical History  Procedure Laterality Date  . Nasal sinus surgery  2000  . Esophagogastroduodenoscopy  02/10/2011    Procedure: ESOPHAGOGASTRODUODENOSCOPY (EGD);  Surgeon: Petra Kuba, MD;  Location: [facility];  Service: Endoscopy;  Laterality: N/A;  . Savory dilation  02/10/2011    Procedure: SAVORY DILATION;  Surgeon: Petra Kuba, MD;  Location: [facility];  Service: Endoscopy;  Laterality: N/A;   Family History  Problem Relation Age of Onset  . Anesthesia problems Neg Hx    History  Substance Use Topics  . Smoking status: Never Smoker   . Smokeless tobacco: Never Used  . Alcohol Use: 0.6 oz/week    1 Cans of beer per week    Review of Systems  Constitutional: Positive for diaphoresis.  Respiratory: Positive for shortness of breath.    Cardiovascular: Positive for chest pain. Negative for palpitations.  Gastrointestinal: Positive for nausea. Negative for vomiting and abdominal pain.  Neurological: Negative for dizziness.  All other systems reviewed and are negative.    Allergies  Review of patient's allergies indicates no known allergies.  Home Medications   Current Outpatient Rx  Name  Route  Sig  Dispense  Refill  . aspirin 325 MG tablet   Oral   Take 650 mg by mouth once.         Marland Kitchen aspirin EC 81 MG EC tablet   Oral   Take 1 tablet (81 mg total) by mouth daily.   30 tablet   12   . Coenzyme Q10 300 MG CAPS   Oral   Take 1 capsule by mouth daily.         . nitroGLYCERIN (NITROSTAT) 0.4 MG SL tablet   Sublingual   Place 1 tablet (0.4 mg total) under the tongue every 5 (five) minutes x 3 doses as needed for chest pain.   30 tablet   12   . simvastatin (ZOCOR) 40 MG tablet   Oral   Take 1 tablet (40 mg total) by mouth daily at 6 PM.   30 tablet   12    BP 161/71  Pulse 77  SpO2 95% Physical Exam  Nursing note and vitals reviewed. Constitutional: He is oriented to person, place, and time. He appears well-developed and well-nourished. No distress.  HENT:  Head: Normocephalic and atraumatic.  Mouth/Throat:  Oropharynx is clear and moist.  Eyes: Conjunctivae and EOM are normal. Pupils are equal, round, and reactive to light.  Neck: Normal range of motion. Neck supple.  Cardiovascular: Normal rate, regular rhythm and intact distal pulses.   No murmur heard. Pulmonary/Chest: Effort normal and breath sounds normal. No respiratory distress. He has no wheezes. He has no rales. He exhibits no tenderness.  Abdominal: Soft. He exhibits no distension. There is no tenderness. There is no rebound and no guarding.  Musculoskeletal: Normal range of motion. He exhibits no edema and no tenderness.  Neurological: He is alert and oriented to person, place, and time.  Skin: Skin is warm and dry. No rash  noted. No erythema.  Psychiatric: He has a normal mood and affect. His behavior is normal.    ED Course  Procedures (including critical care time) Labs Reviewed  BASIC METABOLIC PANEL - Abnormal; Notable for the following:    Glucose, Bld 106 (*)    All other components within normal limits  CBC WITH DIFFERENTIAL  PROTIME-INR  APTT  CK  TROPONIN I  TROPONIN I  TROPONIN I  POCT I-STAT TROPONIN I   Dg Chest 2 View  08/09/2012   *RADIOLOGY REPORT*  Clinical Data: Left-sided chest pain radiating into the left upper extremity and jaw.  Shortness of breath.  Current history of coronary artery disease.  CHEST - 2 VIEW  Comparison: Coronary c t a 09/03/2011.  Portable chest x-ray 09/02/2011.Two-view chest x-ray 08/31/2011.  Findings: Cardiac silhouette upper normal in size, unchanged. Hilar and mediastinal contours otherwise unremarkable.  Possible nodule in the right upper lobe.  Lungs otherwise clear. Bronchovascular markings normal.  Pulmonary vascularity normal.  No pneumothorax.  No pleural effusions.  Degenerative changes involving the thoracic spine.  No significant interval change.  IMPRESSION:  1.  No acute cardiopulmonary disease. 2.  Possible right upper lobe lung nodule.  A non-emergent CT of the chest with contrast may be beneficial in further evaluation to confirm or deny this finding.   Original Report Authenticated By: Hulan Saas, M.D.     Date: 08/09/2012  Rate: 76  Rhythm: normal sinus rhythm and premature atrial contractions (PAC)  QRS Axis: normal  Intervals: normal  ST/T Wave abnormalities: normal  Conduction Disutrbances:none  Narrative Interpretation:   Old EKG Reviewed: unchanged   1. ACS (acute coronary syndrome)     MDM   Pt with symptoms concerning for ACS.  TIMI 2. Associated symptoms include radiation to jaw, nausea, diaphoresis and SOB.   ASA and NTG given with resolution of pain.  Pt seen by Dr. Anne Fu last year with stress test that was only mildly  abnormal but did not require cath at that time.  However todays sx are new.  Currently pain free. EKG, CXR, CBC, BMP, CE, Coags pending.  EKG with PVC but o/w no acute findings however pain started about 1 hour prior to arrival.  Pt state pain is starting to return.  Will get repeat EKG. Will discuss with cardiology.    Gwyneth Sprout, MD 08/09/12 2237

## 2012-08-10 LAB — TROPONIN I
Troponin I: <0.3 ng/mL (ref <0.30)
Troponin I: <0.3 ng/mL (ref <0.30)

## 2012-08-10 LAB — BASIC METABOLIC PANEL
BUN: 15 mg/dL (ref 6–23)
CO2: 27 mEq/L (ref 19–32)
GFR calc non Af Amer: >90 mL/min (ref >90)
Glucose, Bld: 107 mg/dL — ABNORMAL HIGH (ref 70–99)
Potassium: 4.1 mEq/L (ref 3.5–5.1)
Sodium: 137 mEq/L (ref 135–145)

## 2012-08-10 LAB — CBC
HCT: 44.2 % (ref 39.0–52.0)
Hemoglobin: 15.4 g/dL (ref 13.0–17.0)
MCV: 87.7 fL (ref 78.0–100.0)
RDW: 13.4 % (ref 11.5–15.5)
WBC: 8.9 10*3/uL (ref 4.0–10.5)

## 2012-08-10 LAB — PROTIME-INR: INR: 1.05 (ref 0.00–1.49)

## 2012-08-10 MED ORDER — SODIUM CHLORIDE 0.9 % IV SOLN
INTRAVENOUS | Status: DC
  Administered 2012-08-12: 04:00:00 via INTRAVENOUS

## 2012-08-10 MED ORDER — SODIUM CHLORIDE 0.9 % IJ SOLN: 3.0000 mL | INTRAMUSCULAR | Status: DC | PRN

## 2012-08-10 MED ORDER — SODIUM CHLORIDE 0.9 % IV SOLN: INTRAVENOUS | Status: DC

## 2012-08-10 MED ORDER — ASPIRIN 81 MG PO CHEW: 324.0000 mg | CHEWABLE_TABLET | ORAL | Status: DC

## 2012-08-10 MED ORDER — SODIUM CHLORIDE 0.9 % IV SOLN: 250.0000 mL | INTRAVENOUS | Status: DC | PRN

## 2012-08-10 MED ORDER — ASPIRIN 81 MG PO CHEW
324.0000 mg | CHEWABLE_TABLET | ORAL | Status: AC
  Administered 2012-08-12: 324 mg via ORAL
  Filled 2012-08-10: qty 4

## 2012-08-10 MED ORDER — SODIUM CHLORIDE 0.9 % IJ SOLN
3.0000 mL | Freq: Two times a day (BID) | INTRAMUSCULAR | Status: DC
  Administered 2012-08-10 – 2012-08-11 (×3): 3 mL via INTRAVENOUS

## 2012-08-10 MED ORDER — DIAZEPAM 2 MG PO TABS
2.0000 mg | ORAL_TABLET | ORAL | Status: AC
  Administered 2012-08-12: 2 mg via ORAL
  Filled 2012-08-10: qty 1

## 2012-08-10 NOTE — Progress Notes (Signed)
Pt c/o of palpitations around 2100. VS 168/70, hr 70, r 20, t-97.7, spo2 98% on RA. Rhythm noted to be NSR with PVCs. Resolved spontaneously. MD notified. New orders for BMET in the am. Will continue to monitor the pt.Sanda Linger

## 2012-08-10 NOTE — Progress Notes (Addendum)
SUBJECTIVE:  No further jaw pain, chest burning or SOB  OBJECTIVE:   Vitals:   Filed Vitals:   08/09/12 2145 08/09/12 2200 08/09/12 2249 08/10/12 0533  BP: 144/87 151/81 140/70 138/79  Pulse: 76 79 65 56  Temp:   98.3 F (36.8 C) 97.7 F (36.5 C)  TempSrc:   Oral Oral  Resp: 20 19 20 20   Height:   6' (1.829 m)   Weight:   110.224 kg (243 lb)   SpO2: 95% 95% 94% 96%   I&O's:  No intake or output data in the 24 hours ending 08/10/12 0906 TELEMETRY: Reviewed telemetry pt in HSR with PVC's:     PHYSICAL EXAM General: Well developed, well nourished, in no acute distress Head: Eyes PERRLA, No xanthomas.   Normal cephalic and atramatic  Lungs:   Clear bilaterally to auscultation and percussion. Heart:   HRRR S1 S2 Pulses are 2+ & equal.  Occasional ectopy Abdomen: Bowel sounds are positive, abdomen soft and non-tender without masses Extremities:   No clubbing, cyanosis or edema.  DP +1 Neuro: Alert and oriented X 3. Psych:  Good affect, responds appropriately   LABS: Basic Metabolic Panel:  Recent Labs  45/40/98 1832 08/09/12 2300  NA 136 136  K 3.8 3.5  CL 100 102  CO2 25 26  GLUCOSE 106* 186*  BUN 18 17  CREATININE 0.85 0.84  CALCIUM 9.0 8.9   Liver Function Tests: No results found for this basename: AST, ALT, ALKPHOS, BILITOT, PROT, ALBUMIN,  in the last 72 hours No results found for this basename: LIPASE, AMYLASE,  in the last 72 hours CBC:  Recent Labs  08/09/12 1832  WBC 7.7  NEUTROABS 4.7  HGB 14.4  HCT 41.9  MCV 86.9  PLT 226   Cardiac Enzymes:  Recent Labs  08/09/12 1832 08/09/12 2300 08/10/12 0350  CKTOTAL 195  --   --   TROPONINI  --  <0.30 <0.30    Lab Results  Component Value Date   INR 0.99 08/09/2012   INR 1.03 09/01/2011   INR 0.98 08/31/2011    RADIOLOGY: Dg Chest 2 View  08/09/2012   *RADIOLOGY REPORT*  Clinical Data: Left-sided chest pain radiating into the left upper extremity and jaw.  Shortness of breath.  Current history  of coronary artery disease.  CHEST - 2 VIEW  Comparison: Coronary c t a 09/03/2011.  Portable chest x-ray 09/02/2011.Two-view chest x-ray 08/31/2011.  Findings: Cardiac silhouette upper normal in size, unchanged. Hilar and mediastinal contours otherwise unremarkable.  Possible nodule in the right upper lobe.  Lungs otherwise clear. Bronchovascular markings normal.  Pulmonary vascularity normal.  No pneumothorax.  No pleural effusions.  Degenerative changes involving the thoracic spine.  No significant interval change.  IMPRESSION:  1.  No acute cardiopulmonary disease. 2.  Possible right upper lobe lung nodule.  A non-emergent CT of the chest with contrast may be beneficial in further evaluation to confirm or deny this finding.   Original Report Authenticated By: Hulan Saas, M.D.   Assessment and Plan:  52 yo male with PMH sig for nonobstructive CAD by CT angio 08/2011, pre-hypertension and a strong family history of CAD presenting with a good story for cardiac chest pain --> now chest pain free.  Encouragingly, he has not had recurrent symptoms, is currently chest pain free and initial labs and EKG are benign. However, his story and history are concerning enough that he warrants further observation with serial enzymes and telemetry.Alternative diagnosis could be  his hiatal hernia though cardiac cause needs to be thoroughly evaluated first.  Regarding his shoulder discomfort, may be related to his simvastatin. Will switch to atorvastatin. Nl CPK.  New diagonsis of hypertension given that by history he is elevated. Will keep nitro paste on overnight but consider adding agents prior to discharge.  NPO after midnight Sunday for cath Monday by Dr. Anne Fu If he has recurrent CP will start IV Heparin gtt  Cardiac catheterization was discussed with the patient fully including risks on myocardial infarction, death, stroke, bleeding, arrhythmia, dye allergy, renal insufficiency or bleeding.  All patient  questions and concerns were discussed and the patient understands and is willing to proceed.      Quintella Reichert, MD  08/10/2012  9:06 AM

## 2012-08-11 DIAGNOSIS — I2510 Atherosclerotic heart disease of native coronary artery without angina pectoris: Secondary | ICD-10-CM | POA: Diagnosis present

## 2012-08-11 LAB — BASIC METABOLIC PANEL
BUN: 15 mg/dL (ref 6–23)
GFR calc Af Amer: >90 mL/min (ref >90)
GFR calc non Af Amer: >90 mL/min (ref >90)
Potassium: 5 mEq/L (ref 3.5–5.1)
Sodium: 137 mEq/L (ref 135–145)

## 2012-08-11 NOTE — Progress Notes (Addendum)
SUBJECTIVE: no further chest pain.  Had palpitations overnight with PVC's noted on tele OBJECTIVE:   Vitals:   Filed Vitals:   08/10/12 0533 08/10/12 2100 08/11/12 0021 08/11/12 0500  BP: 138/79 168/70 115/67 118/68  Pulse: 56 67  56  Temp: 97.7 F (36.5 C) 97.7 F (36.5 C)  98.1 F (36.7 C)  TempSrc: Oral     Resp: 20 20  18   Height:      Weight:      SpO2: 96% 98%  98%   I&O's:  No intake or output data in the 24 hours ending 08/11/12 0726 TELEMETRY: Reviewed telemetry pt in NSR with PVC's:     PHYSICAL EXAM General: Well developed, well nourished, in no acute distress Head: Eyes PERRLA, No xanthomas.   Normal cephalic and atramatic  Lungs:   Clear bilaterally to auscultation and percussion. Heart:   HRRR S1 S2 Pulses are 2+ & equal. Abdomen: Bowel sounds are positive, abdomen soft and non-tender without masses  Extremities:   No clubbing, cyanosis or edema.  DP +1 Neuro: Alert and oriented X 3. Psych:  Good affect, responds appropriately   LABS: Basic Metabolic Panel:  Recent Labs  16/10/96 2300 08/10/12 1215  NA 136 137  K 3.5 4.1  CL 102 100  CO2 26 27  GLUCOSE 186* 107*  BUN 17 15  CREATININE 0.84 0.87  CALCIUM 8.9 9.2   Liver Function Tests: No results found for this basename: AST, ALT, ALKPHOS, BILITOT, PROT, ALBUMIN,  in the last 72 hours No results found for this basename: LIPASE, AMYLASE,  in the last 72 hours CBC:  Recent Labs  08/09/12 1832 08/10/12 1215  WBC 7.7 8.9  NEUTROABS 4.7  --   HGB 14.4 15.4  HCT 41.9 44.2  MCV 86.9 87.7  PLT 226 241   Cardiac Enzymes:  Recent Labs  08/09/12 1832 08/09/12 2300 08/10/12 0350 08/10/12 1215  CKTOTAL 195  --   --   --   TROPONINI  --  <0.30 <0.30 <0.30    Lab Results  Component Value Date   INR 1.05 08/10/2012   INR 0.99 08/09/2012   INR 1.03 09/01/2011    RADIOLOGY: Dg Chest 2 View  08/09/2012   *RADIOLOGY REPORT*  Clinical Data: Left-sided chest pain radiating into the left upper  extremity and jaw.  Shortness of breath.  Current history of coronary artery disease.  CHEST - 2 VIEW  Comparison: Coronary c t a 09/03/2011.  Portable chest x-ray 09/02/2011.Two-view chest x-ray 08/31/2011.  Findings: Cardiac silhouette upper normal in size, unchanged. Hilar and mediastinal contours otherwise unremarkable.  Possible nodule in the right upper lobe.  Lungs otherwise clear. Bronchovascular markings normal.  Pulmonary vascularity normal.  No pneumothorax.  No pleural effusions.  Degenerative changes involving the thoracic spine.  No significant interval change.  IMPRESSION:  1.  No acute cardiopulmonary disease. 2.  Possible right upper lobe lung nodule.  A non-emergent CT of the chest with contrast may be beneficial in further evaluation to confirm or deny this finding.   Original Report Authenticated By: Hulan Saas, M.D.    Assessment and Plan:  52 yo male with PMH sig for nonobstructive CAD by CT angio 08/2011, pre-hypertension and a strong family history of CAD presenting with a good story for cardiac chest pain --> now chest pain free.  Encouragingly, he has not had recurrent symptoms, is currently chest pain free and initial labs and EKG are benign. However, his story and history  are concerning enough that he warrants further observation with serial enzymes and telemetry.Alternative diagnosis could be his hiatal hernia though cardiac cause needs to be thoroughly evaluated first.  Regarding his shoulder discomfort, may be related to his simvastatin. Changed to atorvastatin. Nl CPK.  New diagonsis of hypertension given that by history he is elevated.  - NPO after midnight Sunday for cath Monday by Dr. Anne Fu  - If he has recurrent CP will start IV Heparin gtt  - no beta blocker due to resting bradycardia - Continue ASA/statin/NTPaste Cardiac catheterization was discussed with the patient fully including risks on myocardial infarction, death, stroke, bleeding, arrhythmia, dye  allergy, renal insufficiency or bleeding. All patient questions and concerns were discussed and the patient understands and is willing to proceed.      Quintella Reichert, MD  08/11/2012  7:26 AM

## 2012-08-11 NOTE — Care Management Utilization Note (Signed)
Utilization review completed.  

## 2012-08-11 NOTE — Progress Notes (Signed)
Notified of pt's bun being tripled & creat going up to >1.8. MD notified. Labs being redrawn. Will continue to monitor the pt. Sanda Linger, RN

## 2012-08-12 ENCOUNTER — Encounter (HOSPITAL_COMMUNITY)
Admission: EM | Disposition: A | Discharge status: Home / Self Care | Payer: Self-pay | Source: Home / Self Care | Attending: Emergency Medicine

## 2012-08-12 HISTORY — PX: LEFT HEART CATHETERIZATION WITH CORONARY ANGIOGRAM: SHX5451

## 2012-08-12 LAB — BASIC METABOLIC PANEL
Chloride: 100 mEq/L (ref 96–112)
GFR calc Af Amer: >90 mL/min (ref >90)
GFR calc non Af Amer: >90 mL/min (ref >90)
Glucose, Bld: 111 mg/dL — ABNORMAL HIGH (ref 70–99)
Potassium: 4.2 mEq/L (ref 3.5–5.1)
Sodium: 137 mEq/L (ref 135–145)

## 2012-08-12 LAB — CBC
Hemoglobin: 14.7 g/dL (ref 13.0–17.0)
MCHC: 34.8 g/dL (ref 30.0–36.0)
RDW: 13.3 % (ref 11.5–15.5)
WBC: 6.4 10*3/uL (ref 4.0–10.5)

## 2012-08-12 LAB — PROTIME-INR: INR: 0.96 (ref 0.00–1.49)

## 2012-08-12 SURGERY — LEFT HEART CATHETERIZATION WITH CORONARY ANGIOGRAM
Anesthesia: Moderate Sedation | Laterality: Bilateral

## 2012-08-12 MED ORDER — ATORVASTATIN CALCIUM 40 MG PO TABS: 40.0000 mg | ORAL_TABLET | Freq: Every day | ORAL | Status: DC

## 2012-08-12 MED ORDER — HEPARIN SODIUM (PORCINE) 1000 UNIT/ML IJ SOLN
INTRAMUSCULAR | Status: AC
  Filled 2012-08-12: qty 1

## 2012-08-12 MED ORDER — FENTANYL CITRATE 0.05 MG/ML IJ SOLN
INTRAMUSCULAR | Status: AC
  Filled 2012-08-12: qty 2

## 2012-08-12 MED ORDER — HEPARIN (PORCINE) IN NACL 2-0.9 UNIT/ML-% IJ SOLN
INTRAMUSCULAR | Status: AC
  Filled 2012-08-12: qty 1000

## 2012-08-12 MED ORDER — NITROGLYCERIN 0.2 MG/ML ON CALL CATH LAB
INTRAVENOUS | Status: AC
  Filled 2012-08-12: qty 1

## 2012-08-12 MED ORDER — ISOSORBIDE MONONITRATE ER 30 MG PO TB24: 30.0000 mg | ORAL_TABLET | Freq: Every day | ORAL | Status: DC

## 2012-08-12 MED ORDER — MIDAZOLAM HCL 2 MG/2ML IJ SOLN
INTRAMUSCULAR | Status: AC
  Filled 2012-08-12: qty 2

## 2012-08-12 MED ORDER — ONDANSETRON HCL 4 MG/2ML IJ SOLN: 4.0000 mg | Freq: Four times a day (QID) | INTRAMUSCULAR | Status: DC | PRN

## 2012-08-12 MED ORDER — VERAPAMIL HCL 2.5 MG/ML IV SOLN
INTRAVENOUS | Status: AC
  Filled 2012-08-12: qty 2

## 2012-08-12 MED ORDER — ACETAMINOPHEN 325 MG PO TABS: 650.0000 mg | ORAL_TABLET | ORAL | Status: DC | PRN

## 2012-08-12 MED ORDER — SODIUM CHLORIDE 0.9 % IV SOLN: 1.0000 mL/kg/h | INTRAVENOUS | Status: AC

## 2012-08-12 MED ORDER — LIDOCAINE HCL (PF) 1 % IJ SOLN
INTRAMUSCULAR | Status: AC
  Filled 2012-08-12: qty 30

## 2012-08-12 NOTE — Progress Notes (Signed)
Subjective:  Feels well this am. No CP.   Objective:  Vital Signs in the last 24 hours: Temp:  [97.7 F (36.5 C)-98.1 F (36.7 C)] 97.8 F (36.6 C) (06/30 0500) Pulse Rate:  [61-75] 61 (06/30 0500) Resp:  [18] 18 (06/30 0500) BP: (132-164)/(76-80) 132/80 mmHg (06/30 0500) SpO2:  [99 %-100 %] 99 % (06/30 0500)  Intake/Output from previous day:     Physical Exam: General: Well developed, well nourished, in no acute distress. Head:  Normocephalic and atraumatic. Lungs: Clear to auscultation and percussion. Heart: Normal S1 and S2.  No murmur, rubs or gallops.  Abdomen: soft, non-tender, positive bowel sounds. Extremities: No clubbing or cyanosis. No edema. Neurologic: Alert and oriented x 3.    Lab Results:  Recent Labs  08/10/12 1215 08/12/12 0810  WBC 8.9 6.4  HGB 15.4 14.7  PLT 241 213    Recent Labs  08/11/12 0755 08/12/12 0810  NA 137 137  K 5.0 4.2  CL 99 100  CO2 29 26  GLUCOSE 147* 111*  BUN 15 14  CREATININE 0.87 0.86    Recent Labs  08/10/12 0350 08/10/12 1215  TROPONINI <0.30 <0.30     Telemetry: SB 50 transient. No VT. Personally viewed.   Cardiac Studies:  Prior cardiac CT, moderate LAD lesion (distal).   Assessment/Plan:  Active Problems:   CAD (coronary artery disease)  10 with [place], CAD. Progressive angina symptoms. Father MI 31   - Await cardiac cath  - Risk discussed with he and wife  - Family history of MI noted.    Zachary Hamilton 08/12/2012, 8:54 AM

## 2012-08-12 NOTE — CV Procedure (Signed)
CARDIAC CATHETERIZATION  PROCEDURE:  Left heart catheterization with selective coronary angiography, left ventriculogram via the radial artery approach.  INDICATIONS:  52 year old male whose father had heart attack at age 95 previous CT angiogram demonstrating a 50-75% distal LAD lesion with calcification and presented with rest angina, unstable angina. Troponin has been normal.  The risks, benefits, and details of the procedure were explained to the patient, including possibilities of stroke, heart attack, death, renal impairment, arterial damage, bleeding.  The patient verbalized understanding and wanted to proceed.  Informed written consent was obtained.  PROCEDURE TECHNIQUE:  Allen's test was performed pre-and post procedure and was normal. The right radial artery site was prepped and draped in a sterile fashion. One percent lidocaine was used for local anesthesia. Using the modified Seldinger technique a 5 French hydrophilic sheath was inserted into the radial artery without difficulty. 3 mg of verapamil was administered via the sheath. A Judkins right #4 catheter with the guidance of a Versicore wire was placed in the right coronary cusp and selectively cannulated the right coronary artery. After traversing the aortic arch, 5000 units of heparin IV was administered. A Judkins left #3.5 catheter was used to selectively cannulate the left main artery. Multiple views with hand injection of Omnipaque were obtained. Catheter a pigtail catheter was used to cross into the left ventricle, hemodynamics were obtained, and a left ventriculogram was performed in the RAO position with power injection. Following the procedure, sheath was removed, patient was hemodynamically stable, hemostasis was maintained with a Terumo T band.   CONTRAST:  Total of 60 ml.    FLOUROSCOPY TIME: 2.1 min.  COMPLICATIONS:  None.    HEMODYNAMICS:  Aortic pressure was 133/101mmHg; LV systolic pressure was ; LVEDP .   There was no gradient between the left ventricle and aorta.    ANGIOGRAPHIC DATA:    Left main: Left main bifurcates into the LAD, high ramus/OM, circumflex artery. No angiographically significant disease.  Left anterior descending (LAD): There is a mid to distal LAD lesion of approximately 50%, eccentric, does not appear to be flow-limiting. The LAO cranial view demonstrates what appears to be the most significant stenosis in this region. The other views, LAO cranial views with RAO/AP show some haziness in this region but no significant flow limiting stenosis. Overall lesion is approximately 50%. The LAD then continues to wrap around the apex.  Circumflex artery (CIRC): There are 2 high obtuse marginal branches which are moderate sized caliber, first vessel has 30% ostial stenosis.  Right coronary artery (RCA): Mild distal RCA disease, minor luminal irregularities, 20% at its most in the distal segment. This gives rise to the posterior descending artery and is dominant.  LEFT VENTRICULOGRAM:  Left ventricular angiogram was done in the 30 RAO projection and revealed normal left ventricular wall motion and systolic function with an estimated ejection fraction of 55%.   IMPRESSIONS:  Mid LAD lesion of approximately 50%, eccentric plaque visual he worst in the LAO cranial view but other views demonstrate what appears to be no flow-limiting coronary artery disease. Minor irregularities in the proximal high obtuse marginal branch and distal RCA. Normal left ventricular systolic function.  LVEDP 12 mmHg.  Ejection fraction 55%.  RECOMMENDATION:  Continue with prevention strategy. He was wondering if simvastatin is causing him some muscular discomfort. He wishes to change to atorvastatin. This is completely reasonable. We may wish to place him on low-dose anti-anginal such as isosorbide to see if this helps. Continue to monitor him.

## 2012-08-12 NOTE — Discharge Summary (Signed)
Patient ID: Zachary Hamilton. MRN: [id number] DOB/AGE: 02/22/1960 52 y.o.  Admit date: 08/09/2012 Discharge date: 08/12/2012  Primary Discharge Diagnosis: Chest pain at rest-unstable angina Secondary Discharge Diagnosis: Mid LAD coronary artery disease, early family history of coronary artery disease, hyperlipidemia  Significant Diagnostic Studies:  CARDIAC CATHETERIZATION  PROCEDURE: Left heart catheterization with selective coronary angiography, left ventriculogram via the radial artery approach.  INDICATIONS: 52 year old male whose father had heart attack at age 81 previous CT angiogram demonstrating a 50-75% distal LAD lesion with calcification and presented with rest angina, unstable angina. Troponin has been normal.  The risks, benefits, and details of the procedure were explained to the patient, including possibilities of stroke, heart attack, death, renal impairment, arterial damage, bleeding. The patient verbalized understanding and wanted to proceed. Informed written consent was obtained.  PROCEDURE TECHNIQUE: Allen's test was performed pre-and post procedure and was normal. The right radial artery site was prepped and draped in a sterile fashion. One percent lidocaine was used for local anesthesia. Using the modified Seldinger technique a 5 French hydrophilic sheath was inserted into the radial artery without difficulty. 3 mg of verapamil was administered via the sheath. A Judkins right #4 catheter with the guidance of a Versicore wire was placed in the right coronary cusp and selectively cannulated the right coronary artery. After traversing the aortic arch, 5000 units of heparin IV was administered. A Judkins left #3.5 catheter was used to selectively cannulate the left main artery. Multiple views with hand injection of Omnipaque were obtained. Catheter a pigtail catheter was used to cross into the left ventricle, hemodynamics were obtained, and a left ventriculogram was performed in the  RAO position with power injection. Following the procedure, sheath was removed, patient was hemodynamically stable, hemostasis was maintained with a Terumo T band.  CONTRAST: Total of 60 ml.  FLOUROSCOPY TIME: 2.1 min.  COMPLICATIONS: None.  HEMODYNAMICS: Aortic pressure was 133/31mmHg; LV systolic pressure was ; LVEDP . There was no gradient between the left ventricle and aorta.  ANGIOGRAPHIC DATA:  Left main: Left main bifurcates into the LAD, high ramus/OM, circumflex artery. No angiographically significant disease.  Left anterior descending (LAD): There is a mid to distal LAD lesion of approximately 50%, eccentric, does not appear to be flow-limiting. The LAO cranial view demonstrates what appears to be the most significant stenosis in this region. The other views, LAO cranial views with RAO/AP show some haziness in this region but no significant flow limiting stenosis. Overall lesion is approximately 50%. The LAD then continues to wrap around the apex.  Circumflex artery (CIRC): There are 2 high obtuse marginal branches which are moderate sized caliber, first vessel has 30% ostial stenosis.  Right coronary artery (RCA): Mild distal RCA disease, minor luminal irregularities, 20% at its most in the distal segment. This gives rise to the posterior descending artery and is dominant.  LEFT VENTRICULOGRAM: Left ventricular angiogram was done in the 30 RAO projection and revealed normal left ventricular wall motion and systolic function with an estimated ejection fraction of 55%.  IMPRESSIONS:  1. Mid LAD lesion of approximately 50%, eccentric plaque visual he worst in the LAO cranial view but other views demonstrate what appears to be no flow-limiting coronary artery disease. Minor irregularities in the proximal high obtuse marginal branch and distal RCA. 2. Normal left ventricular systolic function. LVEDP 12 mmHg. Ejection fraction 55%. RECOMMENDATION: Continue with prevention strategy.  He was wondering if simvastatin is causing him some muscular discomfort. He wishes to change  to atorvastatin. This is completely reasonable. We may wish to place him on low-dose anti-anginal such as isosorbide to see if this helps. Continue to monitor him.    Hospital Course: 52 year old with significant family history of coronary artery disease who presented with chest discomfort at rest that was concerning for unstable angina. He previously had a CT angiogram in July of 2013 that showed a distal LAD lesion that was felt to be nonobstructive. He also stated that he felt some musculoskeletal discomfort along his left chest wall infarct that it may be secondary to simvastatin. This was also radiating to his shoulder as well. Atorvastatin was started here in hospital. He does have resting bradycardia therefore no beta blocker was utilized as an antianginal.  Came in the next day for a cardiac catheterization which overall demonstrated a mid LAD lesion that was seen worst in the LAO cranial projection but overall in the other views appear to be eccentric approximately 50% stenosis. Ejection fraction was normal. Isosorbide 30 mg was started. Medical management.   Discharge Exam: Blood pressure 132/80, pulse 68, temperature 97.8 F (36.6 C), temperature source Oral, resp. rate 18, height 6' (1.829 m), weight 110.224 kg (243 lb), SpO2 99.00%.    General: Alert and oriented x3 no acute distress Cardiovascular: Regular rate and rhythm no murmurs rubs or gallops Lungs: Clear to auscultation bilaterally Abdomen: Mildly obese, normal active bowel sounds Extremities: No clubbing cyanosis or edema. Labs:   Lab Results  Component Value Date   WBC 6.4 08/12/2012   HGB 14.7 08/12/2012   HCT 42.3 08/12/2012   MCV 88.1 08/12/2012   PLT 213 08/12/2012    Recent Labs Lab 08/12/12 0810  NA 137  K 4.2  CL 100  CO2 26  BUN 14  CREATININE 0.86  CALCIUM 9.0  GLUCOSE 111*   Lab Results  Component Value Date    CKTOTAL 195 08/09/2012   CKMB 1.5 09/01/2011   TROPONINI <0.30 08/10/2012    Lab Results  Component Value Date   CHOL 217* 09/01/2011   Lab Results  Component Value Date   HDL 46 09/01/2011   Lab Results  Component Value Date   LDLCALC 151* 09/01/2011   Lab Results  Component Value Date   TRIG 101 09/01/2011   Lab Results  Component Value Date   CHOLHDL 4.7 09/01/2011   No results found for this basename: LDLDIRECT      FOLLOW UP PLANS AND APPOINTMENTS Discharge Orders   Future Orders Complete By Expires     Diet - low sodium heart healthy  As directed     Increase activity slowly  As directed         Medication List    STOP taking these medications       simvastatin 40 MG tablet  Commonly known as:  ZOCOR      TAKE these medications       aspirin 81 MG EC tablet  Take 1 tablet (81 mg total) by mouth daily.     atorvastatin 40 MG tablet  Commonly known as:  LIPITOR  Take 1 tablet (40 mg total) by mouth daily at 6 PM.     Coenzyme Q10 300 MG Caps  Take 1 capsule by mouth daily.     isosorbide mononitrate 30 MG 24 hr tablet  Commonly known as:  IMDUR  Take 1 tablet (30 mg total) by mouth daily.     nitroGLYCERIN 0.4 MG SL tablet  Commonly known as:  NITROSTAT  Place 1 tablet (0.4 mg total) under the tongue every 5 (five) minutes x 3 doses as needed for chest pain.           Follow-up Information   Follow up with Donato Schultz, MD On 08/26/2012. (11:30am)    Contact information:   [address] [place] [address] [phone]       BRING ALL MEDICATIONS WITH YOU TO FOLLOW UP APPOINTMENTS  Time spent with patient to include physician time:33min He knows to contact me if any further worrisome symptoms develop. SignedDonato Schultz 08/12/2012, 10:51 AM

## 2012-08-12 NOTE — Interval H&P Note (Signed)
History and Physical Interval Note:  08/12/2012 10:09 AM  Zachary Hamilton.  has presented today for surgery, with the diagnosis of chest pain/CAD  The various methods of treatment have been discussed with the patient and family. After consideration of risks, benefits and other options for treatment, the patient has consented to  Procedure(s): LEFT HEART CATHETERIZATION WITH CORONARY ANGIOGRAM (Bilateral) as a surgical intervention .  The patient's history has been reviewed, patient examined, no change in status, stable for surgery.  I have reviewed the patient's chart and labs.  Questions were answered to the patient's satisfaction.     [name]

## 2012-08-12 NOTE — Progress Notes (Signed)
Right radial TR band removed at 1355, 2x2 applied with tegaderm.  Right radial level 0 no bruising or hematoma.  Reverse Allen's test positive for good blood flow.  Will continue to monitor.  Zachary Hamilton

## 2012-08-12 NOTE — Progress Notes (Signed)
Reviewed discharge instructions with patient and wife, they stated their understanding.  Patient ambulated in hallway without difficulties.  Right radial site level 0.  Patient discharged home with wife via wheelchair.  Colman Cater

## 2012-11-24 ENCOUNTER — Encounter: Payer: Self-pay | Admitting: Cardiology

## 2012-11-24 ENCOUNTER — Encounter: Payer: Self-pay | Admitting: *Deleted

## 2012-11-27 ENCOUNTER — Ambulatory Visit (INDEPENDENT_AMBULATORY_CARE_PROVIDER_SITE_OTHER): Payer: BC Managed Care – PPO | Admitting: Cardiology

## 2012-11-27 ENCOUNTER — Encounter: Payer: Self-pay | Admitting: Cardiology

## 2012-11-27 VITALS — BP 144/88 | HR 67 | Ht 72.0 in | Wt 234.0 lb

## 2012-11-27 DIAGNOSIS — I2510 Atherosclerotic heart disease of native coronary artery without angina pectoris: Secondary | ICD-10-CM

## 2012-11-27 DIAGNOSIS — Z8249 Family history of ischemic heart disease and other diseases of the circulatory system: Secondary | ICD-10-CM

## 2012-11-27 DIAGNOSIS — E785 Hyperlipidemia, unspecified: Secondary | ICD-10-CM

## 2012-11-27 NOTE — Patient Instructions (Addendum)
Your physician recommends that you continue on your current medications as directed. Please refer to the Current Medication list given to you today.   *Will consider Crestor 10 mg once weekly at follow-up visit.  Your physician wants you to follow-up in: 6 months with Dr. Algis Liming will receive a reminder letter in the mail two months in advance. If you don't receive a letter, please call our office to schedule the follow-up appointment.

## 2012-11-27 NOTE — Progress Notes (Signed)
[address] [place], [place]  [address] Phone: [phone] Fax:  [phone]  Date:  11/27/2012   ID:  Zachary Hamilton, DOB 08/07/1960, MRN [id number]  PCP:   Duane Lope, MD   History of Present Illness: Zachary Hamilton is a 52 y.o. male underwent cardiac catheterization 08/12/12 with a family history of father having heart attack age 70 with previous CT angiogram demonstrated a 50-75% distal LAD lesion experienced unstable angina/rest anginal symptoms here for followup. His cardiac catheterization demonstrated a mid LAD lesion of approximately 50%, eccentric plaque is seen in the LAO cranial view but in other views appeared to be nonflow limiting. He had mild luminal irregularities in the proximal hiatus marginal branch as well as distal RCA. His EF was 55%, normal. Simvastatin was changed to atorvastatin after he was wondering if muscle discomfort was coming from this. Low-dose isosorbide to see if helps with his symptoms.  Feeling great. Statins he believes were causing him issues. 10 day detox. Lost 12 pounds. Cut caffeine.    Wt Readings from Last 3 Encounters:  11/27/12 234 lb (106.142 kg)  08/09/12 243 lb (110.224 kg)  08/09/12 243 lb (110.224 kg)     Past Medical History  Diagnosis Date  . Dysphagia   . Chronic kidney disease   . GERD (gastroesophageal reflux disease)   . Coronary artery disease     CTA 09/03/11 - 50-75% distal LAD, calcified prox LAD. NUC stress 09/05/11 - no flow limiting dz.   . Hypercholesteremia     Past Surgical History  Procedure Laterality Date  . Nasal sinus surgery  2000  . Esophagogastroduodenoscopy  02/10/2011    Procedure: ESOPHAGOGASTRODUODENOSCOPY (EGD);  Surgeon: Petra Kuba, MD;  Location: [facility];  Service: Endoscopy;  Laterality: N/A;  . Savory dilation  02/10/2011    Procedure: SAVORY DILATION;  Surgeon: Petra Kuba, MD;  Location: [facility];  Service: Endoscopy;  Laterality: N/A;    Current  Outpatient Prescriptions  Medication Sig Dispense Refill  . Coenzyme Q10 300 MG CAPS Take 1 capsule by mouth daily.      . isosorbide mononitrate (IMDUR) 30 MG 24 hr tablet Take 1 tablet (30 mg total) by mouth daily.  30 tablet  12  . nitroGLYCERIN (NITROSTAT) 0.4 MG SL tablet Place 1 tablet (0.4 mg total) under the tongue every 5 (five) minutes x 3 doses as needed for chest pain.  30 tablet  12  . atorvastatin (LIPITOR) 40 MG tablet Take 1 tablet (40 mg total) by mouth daily at 6 PM.  30 tablet  12   No current facility-administered medications for this visit.    Allergies:   No Known Allergies  Social History:  The patient  reports that he has never smoked. He has never used smokeless tobacco. He reports that he drinks about 0.6 ounces of alcohol per week. He reports that he does not use illicit drugs.   ROS:  Please see the history of present illness.   Denies any fevers, chills, orthopnea, PND, syncope, chest pain   All other systems reviewed and negative.   PHYSICAL EXAM: VS:  BP 144/88  Pulse 67  Ht 6' (1.829 m)  Wt 234 lb (106.142 kg)  BMI 31.73 kg/m2  SpO2 98% Well nourished, well developed, in no acute distress HEENT: normal Neck: no JVD Cardiac:  normal S1, S2; RRR; no murmur Lungs:  clear to auscultation bilaterally, no wheezing, rhonchi or rales  Abd: soft, nontender, no hepatomegaly Ext: no edema Skin: warm and dry Neuro: no focal abnormalities noted     ASSESSMENT AND PLAN:  1. Coronary artery disease-eccentric LAD plaque as described above. He has not been on statin therapy over the past several months and she does feel better. He is trying to work on fitness, decreasing inflammation in his diet. I did state that with coronary artery disease there is increased risk without being on statin and I would like for him to at least contemplate once weekly Crestor 10 mg once a day. He has had problems in the past with simvastatin. Atorvastatin was recently stopped. He will  think about this and we will discuss in followup visit in 6 months. 2. Obesity-good job with weight loss. Continue. Discussed at length. 3. Hypertension-mildly elevated today. Overall doing well.  Signed, Donato Schultz, MD [facility]  11/27/2012 4:22 PM

## 2012-12-19 ENCOUNTER — Other Ambulatory Visit: Payer: Self-pay

## 2013-08-01 ENCOUNTER — Other Ambulatory Visit: Payer: Self-pay | Admitting: Gastroenterology

## 2013-08-27 ENCOUNTER — Other Ambulatory Visit: Payer: Self-pay

## 2013-08-27 MED ORDER — ISOSORBIDE MONONITRATE ER 30 MG PO TB24: 30.0000 mg | ORAL_TABLET | Freq: Every day | ORAL | Status: DC

## 2014-01-22 ENCOUNTER — Encounter (HOSPITAL_COMMUNITY): Payer: Self-pay | Admitting: Cardiology

## 2014-03-28 ENCOUNTER — Other Ambulatory Visit: Payer: Self-pay | Admitting: Cardiology

## 2014-03-30 NOTE — Telephone Encounter (Signed)
PATIENT NEEDS APPT FOR REFILLS

## 2014-08-25 ENCOUNTER — Other Ambulatory Visit: Payer: Self-pay | Admitting: Adult Health

## 2014-09-25 ENCOUNTER — Other Ambulatory Visit: Payer: Self-pay | Admitting: Adult Health

## 2014-10-17 ENCOUNTER — Other Ambulatory Visit: Payer: Self-pay | Admitting: Cardiology

## 2014-12-01 ENCOUNTER — Ambulatory Visit (INDEPENDENT_AMBULATORY_CARE_PROVIDER_SITE_OTHER): Payer: BC Managed Care – PPO | Admitting: Cardiology

## 2014-12-01 ENCOUNTER — Encounter: Payer: Self-pay | Admitting: Cardiology

## 2014-12-01 VITALS — BP 160/90 | HR 69 | Ht 72.0 in | Wt 253.4 lb

## 2014-12-01 DIAGNOSIS — R0789 Other chest pain: Secondary | ICD-10-CM

## 2014-12-01 DIAGNOSIS — I2583 Coronary atherosclerosis due to lipid rich plaque: Principal | ICD-10-CM

## 2014-12-01 DIAGNOSIS — K219 Gastro-esophageal reflux disease without esophagitis: Secondary | ICD-10-CM

## 2014-12-01 DIAGNOSIS — I2510 Atherosclerotic heart disease of native coronary artery without angina pectoris: Secondary | ICD-10-CM

## 2014-12-01 DIAGNOSIS — E785 Hyperlipidemia, unspecified: Secondary | ICD-10-CM | POA: Diagnosis not present

## 2014-12-01 MED ORDER — ROSUVASTATIN CALCIUM 5 MG PO TABS: 5.0000 mg | ORAL_TABLET | ORAL | Status: DC

## 2014-12-01 NOTE — Patient Instructions (Signed)
Medication Instructions:  Please start Crestor 5 mg once a week. Continue all other medications as listed.  Testing/Procedures: Your physician has requested that you have a myoview. For further information please visit [email or web address]. Please follow instruction sheet, as given.  You have been referred to the lipid clinic and should schedule to be seen in 2 months.  (Omron Blood pressure monitor)  Follow-Up: Follow up in 1 year with Dr. Marlou Porch.  You will receive a letter in the mail 2 months before you are due.  Please call us when you receive this letter to schedule your follow up appointment.  Thank you for choosing [facility]!!

## 2014-12-01 NOTE — Progress Notes (Signed)
[name] [address], [address], [place]  [address] Phone: [phone] Fax:  [phone]  Date:  12/01/2014   ID:  Zachary Hamilton, DOB 05/22/1960, MRN [id number]  PCP:   Melinda Crutch, MD   History of Present Illness: Zachary Hamilton is a 54 y.o. male underwent cardiac catheterization 08/12/12 with a family history of father having heart attack age 73 with previous CT angiogram demonstrated a 50-75% distal LAD lesion experienced unstable angina/rest anginal symptoms here for followup. His cardiac catheterization demonstrated a mid LAD lesion of approximately 50%, eccentric plaque is seen in the LAO cranial view but in other views appeared to be nonflow limiting. He had mild luminal irregularities in the proximal hiatus marginal branch as well as distal RCA. His EF was 55%, normal.  He has had statin intolerances in the past- Simvastatin was changed to atorvastatin after he was wondering if muscle discomfort was coming from this. Low-dose isosorbide to see if helps with his symptoms.  Statins he believes were causing him issues.   GERD like symptoms. Hard to distinguish between "real chest pain " Pains are in the hotspots of chest in arms and bilateral chest. They do not appear to be exertional. No associated shortness of breath or diaphoresis. Sometimes it feels as though they were on the outside of the chest wall.   Wt Readings from Last 3 Encounters:  12/01/14 253 lb 6.4 oz (114.941 kg)  11/27/12 234 lb (106.142 kg)  08/09/12 243 lb (110.224 kg)     Past Medical History  Diagnosis Date  . Dysphagia   . Chronic kidney disease   . GERD (gastroesophageal reflux disease)   . Coronary artery disease     CTA 09/03/11 - 50-75% distal LAD, calcified prox LAD. NUC stress 09/05/11 - no flow limiting dz.   . Hypercholesteremia     Past Surgical History  Procedure Laterality Date  . Nasal sinus surgery  2000  . Esophagogastroduodenoscopy  02/10/2011    Procedure:  ESOPHAGOGASTRODUODENOSCOPY (EGD);  Surgeon: Jeryl Columbia, MD;  Location: [facility];  Service: Endoscopy;  Laterality: N/A;  . Savory dilation  02/10/2011    Procedure: SAVORY DILATION;  Surgeon: Jeryl Columbia, MD;  Location: [facility];  Service: Endoscopy;  Laterality: N/A;  . Left heart catheterization with coronary angiogram Bilateral 08/12/2012    Procedure: LEFT HEART CATHETERIZATION WITH CORONARY ANGIOGRAM;  Surgeon: Candee Furbish, MD;  Location: [facility];  Service: Cardiovascular;  Laterality: Bilateral;    Current Outpatient Prescriptions  Medication Sig Dispense Refill  . Coenzyme Q10 300 MG CAPS Take 1 capsule by mouth daily.    . isosorbide mononitrate (IMDUR) 30 MG 24 hr tablet TAKE 1 TABLET BY MOUTH EVERY DAY 30 tablet 1  . nitroGLYCERIN (NITROSTAT) 0.4 MG SL tablet Place 1 tablet (0.4 mg total) under the tongue every 5 (five) minutes x 3 doses as needed for chest pain. 30 tablet 12  . omeprazole (PRILOSEC) 20 MG capsule Take 20 mg by mouth daily.     No current facility-administered medications for this visit.    Allergies:   No Known Allergies  Social History:  The patient  reports that he has never smoked. He has never used smokeless tobacco. He reports that he drinks about 0.6 oz of alcohol per week. He reports that he does not use illicit drugs.   ROS:  Please see the history of present illness.   Denies any fevers, chills, orthopnea, PND,  syncope, chest pain   All other systems reviewed and negative.   PHYSICAL EXAM: VS:  BP 160/90 mmHg  Pulse 69  Ht 6' (1.829 m)  Wt 253 lb 6.4 oz (114.941 kg)  BMI 34.36 kg/m2  SpO2 97% Well nourished, well developed, in no acute distress HEENT: normal Neck: no JVD Cardiac:  normal S1, S2; RRR; no murmur Lungs:  clear to auscultation bilaterally, no wheezing, rhonchi or rales Abd: soft, nontender, no hepatomegaly Ext: no edema Skin: warm and dry Neuro: no focal abnormalities noted  EKG: Today 12/01/14-sinus rhythm, 68,  no other abnormalities personally viewed.  Cardiac catheterization as reviewed above    ASSESSMENT AND PLAN:  1. Atypical chest pain-bilateral at times, fairly nondescript. Nonexertional. Nonetheless, he did have a 50% LAD lesion. I will go ahead and proceed with nuclear stress test to ensure that he does not have any high risk features currently. 2. Coronary artery disease-eccentric LAD plaque as described above. In the past he has had statin intolerances, atorvastatin, simvastatin. He is now willing to try Crestor and we will start once weekly 5 mg. He will watch and see if he has any symptoms. I will have him seen in the lipid clinic. We mentioned some alternative therapies, even injectables possibly but he understands cost factors. I did state that with coronary artery disease there is increased risk without being on statin. 3. Obesity-encourage weight loss. Continue. Discussed at length. 4. GERD-P PI, omeprazole. 5. Hypertension- elevated today. He has noted that it has been elevated also with Dr Watt Climes. Instructed him to obtain Omron blood pressure cuff and to continue to monitor. He admits that he may have a degree of whitecoat hypertension. If his blood pressure remains greater than 140/90 consistently, starting on an antihypertensive would be helpful. He also notes that he has not seen [name] in over 2 years.  Signed, Candee Furbish, MD [facility]  12/01/2014 4:08 PM

## 2014-12-07 ENCOUNTER — Other Ambulatory Visit: Payer: Self-pay | Admitting: Cardiology

## 2015-02-01 ENCOUNTER — Telehealth (HOSPITAL_COMMUNITY): Payer: Self-pay | Admitting: *Deleted

## 2015-02-01 NOTE — Telephone Encounter (Signed)
Left message on voicemail in reference to upcoming appointment scheduled for 02/04/15. Phone number given for a call back so details instructions can be given. Hubbard Robinson, RN

## 2015-02-02 ENCOUNTER — Telehealth (HOSPITAL_COMMUNITY): Payer: Self-pay | Admitting: *Deleted

## 2015-02-02 NOTE — Telephone Encounter (Signed)
Left message on voicemail in reference to upcoming appointment scheduled for 02/04/15. Phone number given for a call back so details instructions can be given. Huntington Leverich, Ranae Palms

## 2015-02-03 ENCOUNTER — Telehealth (HOSPITAL_COMMUNITY): Payer: Self-pay | Admitting: *Deleted

## 2015-02-03 NOTE — Telephone Encounter (Signed)
Patient given detailed instructions per Loma Newton, per Myocardial Perfusion Study Information Sheet for the test on 02/04/15. Patient notified to arrive 15 minutes early and that it is imperative to arrive on time for appointment to keep from having the test rescheduled.  If you need to cancel or reschedule your appointment, please call the office within 24 hours of your appointment. Failure to do so may result in a cancellation of your appointment, and a $50 no show fee. Patient verbalized understanding. Hubbard Robinson, RN

## 2015-02-04 ENCOUNTER — Ambulatory Visit (HOSPITAL_COMMUNITY): Payer: BC Managed Care – PPO | Attending: Cardiovascular Disease

## 2015-02-04 ENCOUNTER — Ambulatory Visit (INDEPENDENT_AMBULATORY_CARE_PROVIDER_SITE_OTHER): Payer: BC Managed Care – PPO | Admitting: Pharmacist

## 2015-02-04 ENCOUNTER — Telehealth: Payer: Self-pay | Admitting: *Deleted

## 2015-02-04 DIAGNOSIS — I2510 Atherosclerotic heart disease of native coronary artery without angina pectoris: Secondary | ICD-10-CM

## 2015-02-04 DIAGNOSIS — R9439 Abnormal result of other cardiovascular function study: Secondary | ICD-10-CM | POA: Diagnosis not present

## 2015-02-04 DIAGNOSIS — Z8249 Family history of ischemic heart disease and other diseases of the circulatory system: Secondary | ICD-10-CM | POA: Insufficient documentation

## 2015-02-04 DIAGNOSIS — R079 Chest pain, unspecified: Secondary | ICD-10-CM | POA: Diagnosis not present

## 2015-02-04 DIAGNOSIS — E785 Hyperlipidemia, unspecified: Secondary | ICD-10-CM | POA: Diagnosis not present

## 2015-02-04 DIAGNOSIS — I2583 Coronary atherosclerosis due to lipid rich plaque: Secondary | ICD-10-CM

## 2015-02-04 LAB — LIPID PANEL
CHOL/HDL RATIO: 3.9 ratio (ref <=5.0)
CHOLESTEROL: 178 mg/dL (ref 125–200)
HDL: 46 mg/dL (ref >=40)
LDL CALC: 114 mg/dL (ref <130)
TRIGLYCERIDES: 92 mg/dL (ref <150)
VLDL: 18 mg/dL (ref <30)

## 2015-02-04 LAB — MYOCARDIAL PERFUSION IMAGING
CHL CUP NUCLEAR SRS: 5
CHL CUP NUCLEAR SSS: 5
CHL CUP RESTING HR STRESS: 61 {beats}/min
CSEPED: 9 min
CSEPEDS: 0 s
Estimated workload: 10.4 METS
LV dias vol: 179 mL
LV sys vol: 88 mL
MPHR: 167 {beats}/min
NUC STRESS TID: 0.97
Peak HR: 151 {beats}/min
Percent HR: 90 %
RATE: 0.28
SDS: 0

## 2015-02-04 MED ORDER — TECHNETIUM TC 99M SESTAMIBI GENERIC - CARDIOLITE
10.8000 | Freq: Once | INTRAVENOUS | Status: AC | PRN
  Administered 2015-02-04: 11 via INTRAVENOUS

## 2015-02-04 MED ORDER — TECHNETIUM TC 99M SESTAMIBI GENERIC - CARDIOLITE
32.4000 | Freq: Once | INTRAVENOUS | Status: AC | PRN
  Administered 2015-02-04: 32 via INTRAVENOUS

## 2015-02-04 MED ORDER — AMLODIPINE BESYLATE 5 MG PO TABS: 5.0000 mg | ORAL_TABLET | Freq: Every day | ORAL | Status: DC

## 2015-02-04 NOTE — Patient Instructions (Addendum)
Will call with results of lipid panel. Depending on results we may want to be more aggressive with Crestor and go to a three times a week dosing.   Please call the clinic with any questions at [phone].

## 2015-02-04 NOTE — Telephone Encounter (Signed)
Pt here today for myoview and had elevated BP during stress testing on GXT.  Order was given by Dr Marlou Porch to start Amlodipine 5 mg a day.  Pt is aware and the RX will be sent into [facility].

## 2015-02-04 NOTE — Progress Notes (Signed)
HPI:   Patient in office for stress test. Was to see lipid clinic, but no lipid panel on file since 2013. Ordered a lipid panel for today.   He is intolerant to simvastatin and states it caused muscle aches and him to feel aggressive. He has tried Lipitor and experienced similar symptoms. He is; however, doing well on the Crestor 5mg  once a week. He has not tried Zetia and is not particularly interested in the newer agents due to the cost.   Has tried:  Simvastatin, atorvastatin  Panel results:  TC: 178 LDL: 114 TG: 92 VLDL: 18   A/P:  Will call with results of lipid panel. I suspect that his LDL will be high. He is agreeable to trying Crestor 5mg  three times a week pending the results of the panel. We also discussed potentially trying Zetia.   Per patient Dr. Marlou Porch would like to keep Crestor at 5mg  once weekly as they are starting a new blood pressure medication.

## 2015-02-09 ENCOUNTER — Telehealth: Payer: Self-pay | Admitting: Cardiology

## 2015-02-09 MED ORDER — METOPROLOL SUCCINATE ER 25 MG PO TB24: 25.0000 mg | ORAL_TABLET | Freq: Every day | ORAL | Status: DC

## 2015-02-09 NOTE — Telephone Encounter (Signed)
Reviewed results of myoview and lipid panel with pt who states understanding.  He will start Toprol as ordered.  He will f/u as scheduled in 2 weeks.

## 2015-02-09 NOTE — Telephone Encounter (Signed)
New Message  Pt called for Myoview results please call

## 2015-02-25 ENCOUNTER — Ambulatory Visit (INDEPENDENT_AMBULATORY_CARE_PROVIDER_SITE_OTHER): Payer: BC Managed Care – PPO | Admitting: Cardiology

## 2015-02-25 ENCOUNTER — Encounter: Payer: Self-pay | Admitting: Cardiology

## 2015-02-25 VITALS — BP 170/100 | HR 80 | Ht 72.0 in | Wt 261.8 lb

## 2015-02-25 DIAGNOSIS — I10 Essential (primary) hypertension: Secondary | ICD-10-CM

## 2015-02-25 LAB — BASIC METABOLIC PANEL
BUN: 16 mg/dL (ref 7–25)
CHLORIDE: 100 mmol/L (ref 98–110)
CO2: 27 mmol/L (ref 20–31)
Calcium: 9.1 mg/dL (ref 8.6–10.3)
Creat: 0.78 mg/dL (ref 0.70–1.33)
Glucose, Bld: 97 mg/dL (ref 65–99)
POTASSIUM: 3.6 mmol/L (ref 3.5–5.3)
SODIUM: 136 mmol/L (ref 135–146)

## 2015-02-25 MED ORDER — LOSARTAN POTASSIUM-HCTZ 50-12.5 MG PO TABS: 1.0000 | ORAL_TABLET | Freq: Every day | ORAL | Status: DC

## 2015-02-25 NOTE — Progress Notes (Signed)
02/25/2015 Zachary Hamilton   [birth year]  [id number]  Primary Physician  Melinda Crutch, MD Primary Cardiologist: Dr. Marlou Porch   Reason for Visit/CC: Follow-up for HTN/ medication monitoring  HPI:  55 y/o male followed by Dr. Marlou Porch with h/o CAD and HTN. In 07/2012, he underwent a LHC as part of CP w/u which showed a mid LAD lesion of approximately 50%. Eccentric plaque was seen in the LAO cranial view but in other views appeared to be nonflow limiting. He had mild luminal irregularities in the proximal hiatus marginal branch as well as distal RCA. His EF was 55%. He was treated medically but hasn't been able to tolerate statins.    He was seen by Dr. Marlou Porch 12/01/14, and complained of atypical chest discomfort. A NST was ordered. Patient did not get this done until 02/04/15. This showed minimal ischemia but was overall felt to be low risk. A small, mild, reversible defect in the basal inferior lateral wall consistent with mild ischemia was noted; EF was 51% with normal wall motion; mild LVE. Continued medical therapy was recommended by Dr. Marlou Porch. Given routinely elevated BPs at prior office visits and hypertensive response during stress test, Dr. Marlou Porch ordered for him to start 5 mg of amlodipine daily and 25 mg of Toprol XL daily. Dr. Marlou Porch ordered for him to present back to clinic in 2 weeks for f/u after starting new meds.   Today in clinic, he reports that he has done ok. He continues to have high BP readings at home, despite full medication compliance. He denies excessive sodium and caffeine consumption. He is not a smoker. 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 He gets little physical activity this time of the year. His BP is still poorly controlled at 170/100. He denies HA, visual changes, dyspnea and CP. He does not like the way metoprolol makes him feel (tired/foggy headed).     Current Outpatient Prescriptions  Medication Sig Dispense Refill  .  amLODipine (NORVASC) 5 MG tablet Take 1 tablet (5 mg total) by mouth daily. 30 tablet 6  . Coenzyme Q10 300 MG CAPS Take 1 capsule by mouth daily.    . isosorbide mononitrate (IMDUR) 30 MG 24 hr tablet TAKE 1 TABLET BY MOUTH EVERY DAY 30 tablet 11  . metoprolol succinate (TOPROL-XL) 25 MG 24 hr tablet Take 1 tablet (25 mg total) by mouth daily. 30 tablet 6  . nitroGLYCERIN (NITROSTAT) 0.4 MG SL tablet Place 1 tablet (0.4 mg total) under the tongue every 5 (five) minutes x 3 doses as needed for chest pain. 30 tablet 12  . omeprazole (PRILOSEC) 20 MG capsule Take 20 mg by mouth daily.    . rosuvastatin (CRESTOR) 5 MG tablet Take 5 mg by mouth once a week.    . losartan-hydrochlorothiazide (HYZAAR) 50-12.5 MG tablet Take 1 tablet by mouth daily. 30 tablet 5   No current facility-administered medications for this visit.    No Known Allergies  Social History   Social History  . Marital Status: Married    Spouse Name: N/A  . Number of Children: N/A  . Years of Education: N/A   Occupational History  . Not on file.   Social History Main Topics  . Smoking status: Never Smoker   . Smokeless tobacco: Never Used     Comment: grew up with a smoker  . Alcohol Use: 0.6 oz/week    1 Cans of beer per week  .  Drug Use: No  . Sexual Activity: Not on file   Other Topics Concern  . Not on file   Social History Narrative     Review of Systems: General: negative for chills, fever, night sweats or weight changes.  Cardiovascular: negative for chest pain, dyspnea on exertion, edema, orthopnea, palpitations, paroxysmal nocturnal dyspnea or shortness of breath Dermatological: negative for rash Respiratory: negative for cough or wheezing Urologic: negative for hematuria Abdominal: negative for nausea, vomiting, diarrhea, bright red blood per rectum, melena, or hematemesis Neurologic: negative for visual changes, syncope, or dizziness All other systems reviewed and are otherwise negative except as  noted above.    Blood pressure 170/100, pulse 80, height 6' (1.829 m), weight 261 lb 12.8 oz (118.752 kg).  General appearance: alert, cooperative and no distress Neck: no carotid bruit and no JVD Lungs: clear to auscultation bilaterally Heart: regular rate and rhythm, S1, S2 normal, no murmur, click, rub or gallop Extremities: no LEE Pulses: 2+ and symmetric Skin: warm and dry Neurologic: Grossly normal  EKG not performed.   ASSESSMENT AND PLAN:   1. CAD: LHC in 2014 showed 50% mid LAD lesion. Recent LHC showed mild area of inferior ischemia. Medical therapy elected. Denies any further CP. Continue current meds: BB, statin and nitrate.   2. HTN: BP remains poorly controlled despite full medication compliance and avoidance of triggers. He has a h/o normal renal function but last BMP was from 2014. We will check a BMP today for baseline renal function and K. We will plan to add losartan/HCTZ for additional BP control. Repeat BMP in 10 days + BP check with pharmacy. Continue metoprolol, amlodipine and Imdur.   PLAN  F/u BP check in 10 days with pharmacy. F/u with Dr. Marlou Porch or APP  in 4-6 weeks.   Lyda Jester PA-C 02/25/2015 4:17 PM

## 2015-02-25 NOTE — Patient Instructions (Signed)
Medication Instructions:  Your physician has recommended you make the following change in your medication:  START Hyzaar 50-12.5mg  daiy. An Rx has been sent Ashley: Bmet today  Your physician recommends that you schedule a follow-up appointment in: 10 days (Bmet)   Testing/Procedures:    Follow-Up: You have been referred to our BP Clinic for BP check. Same day as lab  Your physician recommends that you schedule a follow-up appointment in: 6 weeks with Dr.Skains   Any Other Special Instructions Will Be Listed Below (If Applicable).     If you need a refill on your cardiac medications before your next appointment, please call your pharmacy.

## 2015-02-26 ENCOUNTER — Telehealth: Payer: Self-pay | Admitting: *Deleted

## 2015-02-26 NOTE — Telephone Encounter (Signed)
-----   Message from Consuelo Pandy, [place] sent at 02/26/2015  4:14 PM EST ----- Baseline renal function and potassium levels are stable. Continue with new BP medication as directed. Return for repeat labs as ordered in 7-10 days.

## 2015-03-08 ENCOUNTER — Ambulatory Visit (INDEPENDENT_AMBULATORY_CARE_PROVIDER_SITE_OTHER): Payer: BC Managed Care – PPO | Admitting: Pharmacist

## 2015-03-08 ENCOUNTER — Other Ambulatory Visit (INDEPENDENT_AMBULATORY_CARE_PROVIDER_SITE_OTHER): Payer: BC Managed Care – PPO | Admitting: *Deleted

## 2015-03-08 ENCOUNTER — Encounter: Payer: Self-pay | Admitting: Pharmacist

## 2015-03-08 VITALS — BP 152/84 | HR 72

## 2015-03-08 DIAGNOSIS — I10 Essential (primary) hypertension: Secondary | ICD-10-CM

## 2015-03-08 LAB — BASIC METABOLIC PANEL
BUN: 23 mg/dL (ref 7–25)
CO2: 25 mmol/L (ref 20–31)
Calcium: 9.5 mg/dL (ref 8.6–10.3)
Chloride: 97 mmol/L — ABNORMAL LOW (ref 98–110)
Creat: 0.99 mg/dL (ref 0.70–1.33)
Glucose, Bld: 102 mg/dL — ABNORMAL HIGH (ref 65–99)
POTASSIUM: 3.9 mmol/L (ref 3.5–5.3)
SODIUM: 137 mmol/L (ref 135–146)

## 2015-03-08 MED ORDER — AMLODIPINE BESYLATE 10 MG PO TABS: 10.0000 mg | ORAL_TABLET | Freq: Every day | ORAL | Status: DC

## 2015-03-08 NOTE — Patient Instructions (Signed)
Cut your metoprolol in half for the next week, then stop taking it At next refill, start taking higher dose of amlodipine 10mg  daily - move this medicine to take at bedtime Continue taking Hyzaar  Continue checking blood pressure at home  Increase your weekly dose of Crestor as much as you can tolerate Call Yama Nielson in blood pressure/lipid clinic if you have any problems at [phone] Follow up with Dr. Marlou Porch in a month

## 2015-03-08 NOTE — Progress Notes (Signed)
Patient ID: Zachary Hamilton                 DOB: 03/07/1960, 55 yo                       MRN: [id number]     HPI: Zachary Hamilton is a 55 y.o. male patient of Dr. Marlou Porch, referred to HTN clinic by Lyda Jester, PA. He has previously been seen by pharmacy for lipid management as well. PMH is significant for CAD, HTN, and angina. Pt has a strong family history of ASCVD - his father had a heart attack at age 42. Pt underwent CT angiogram that showed 50-75% distal LAD lesion.  Patient reports that his blood pressure did not improve after he started Toprol and that he has been experiencing fatigue and foggy-headedness. He wants to stop taking his Toprol if possible. He was started on this after stress test showed minimal ischemia but hypertensive response to exercise - low risk study overall. Pt reports that his Imdur helped with his angina symptoms and that he has not had any recurrent symptoms since starting the Imdur. He was started on losartan-HCTZ 2 weeks ago and reports improved BP readings since starting this. He checks his BP at home and reports average readings [id number], max systolic [id number], min systolic [id number].   BP today slightly elevated at 152/84, pulse 73. He reports that his BP is usually higher when it is checked at the office compared to when he checks it at home. He denies symptoms of dizziness, lightheadness, or lower extremity swelling.  He is also still taking Crestor 5mg  once weekly and tolerating this well, he has not tried to titrate up the dose. Reports that his CoQ10 helps with myalgias that he has had in the past.  Current HTN meds: amlodipine 5mg  daily, Toprol 25mg  daily, losartan-HCTZ 50-12.5mg  daily  BP goal: <140/30mmHg  Family History: Mother with DM, father with heart attack x5 - was a heavy smoker, sister with breast cancer.  Social History: patient reports that he does not smoke, drinks about 1 can of beer per week, and does not use illicit drugs.  Diet: Avoids  excessive sodium and caffeine consumption.   Exercise: Little physical activity this time of year  Labs: 01/2015: TC 178, TG 92, HDL 46, LDL 114 (Crestor 5mg  weekly). LDL goal 70mg /dL d/t CAD and 50-75% distal LAD lesion.  Wt Readings from Last 3 Encounters:  02/25/15 261 lb 12.8 oz (118.752 kg)  12/01/14 253 lb 6.4 oz (114.941 kg)  11/27/12 234 lb (106.142 kg)   BP Readings from Last 3 Encounters:  02/25/15 170/100  12/01/14 160/90  11/27/12 144/88   Pulse Readings from Last 3 Encounters:  02/25/15 80  12/01/14 69  11/27/12 67    Renal function: Estimated Creatinine Clearance: 142.1 mL/min (by C-G formula based on Cr of 0.78).  Past Medical History  Diagnosis Date  . Dysphagia   . Chronic kidney disease   . GERD (gastroesophageal reflux disease)   . Coronary artery disease     CTA 09/03/11 - 50-75% distal LAD, calcified prox LAD. NUC stress 09/05/11 - no flow limiting dz.   . Hypercholesteremia     Current Outpatient Prescriptions on File Prior to Visit  Medication Sig Dispense Refill  . amLODipine (NORVASC) 5 MG tablet Take 1 tablet (5 mg total) by mouth daily. 30 tablet 6  . Coenzyme Q10 300 MG CAPS Take 1 capsule by mouth daily.    Marland Kitchen  isosorbide mononitrate (IMDUR) 30 MG 24 hr tablet TAKE 1 TABLET BY MOUTH EVERY DAY 30 tablet 11  . losartan-hydrochlorothiazide (HYZAAR) 50-12.5 MG tablet Take 1 tablet by mouth daily. 30 tablet 5  . metoprolol succinate (TOPROL-XL) 25 MG 24 hr tablet Take 1 tablet (25 mg total) by mouth daily. 30 tablet 6  . nitroGLYCERIN (NITROSTAT) 0.4 MG SL tablet Place 1 tablet (0.4 mg total) under the tongue every 5 (five) minutes x 3 doses as needed for chest pain. 30 tablet 12  . omeprazole (PRILOSEC) 20 MG capsule Take 20 mg by mouth daily.    . rosuvastatin (CRESTOR) 5 MG tablet Take 5 mg by mouth once a week.     No current facility-administered medications on file prior to visit.    No Known Allergies   Assessment/Plan:  1.  Hypertension - BP 152/84 still slightly above goal BP <140/61mmHg. Pt has been symptomatic with fatigue and foggy-headed since starting Toprol, which pt reports did not improve BP (likely due to minimal alpha receptor effect). Will have pt taper dose and take 1/2 tab Toprol for a week then discontinue. Will increase amlodipine to 10mg  - should help continue maximizing oxygen supply and reducing oxygen demand with his minor ischemia despite discontinuing beta blocker. He just picked up his amlodipine 5mg  refill, he will continue this and pick up higher dose at next refill. He will also move his amlodipine dose to bedtime. Will continue losartan-HCTZ - rechecked BMET today. Pt will continue to monitor BP at home a few times a day and will call with any significant changes.  2. Hyperlipidemia - Pt's most recent LDL was 114 above goal 70mg /dL given CAD. Will have pt self-titrate up his Crestor 5mg  once weekly to the maximum tolerated dose.  Pt will follow up with Dr. Marlou Porch in 1 month as scheduled.   Zachary Hamilton, PharmD, [facility] [id number], [place], [place] [address] Phone: [phone]; Fax: [phone] 03/08/2015 4:18 PM

## 2015-04-05 ENCOUNTER — Ambulatory Visit (INDEPENDENT_AMBULATORY_CARE_PROVIDER_SITE_OTHER): Payer: BC Managed Care – PPO | Admitting: Cardiology

## 2015-04-05 ENCOUNTER — Encounter: Payer: Self-pay | Admitting: Cardiology

## 2015-04-05 VITALS — BP 174/76 | HR 68 | Ht 72.0 in | Wt 246.0 lb

## 2015-04-05 DIAGNOSIS — I2583 Coronary atherosclerosis due to lipid rich plaque: Principal | ICD-10-CM

## 2015-04-05 DIAGNOSIS — Z8249 Family history of ischemic heart disease and other diseases of the circulatory system: Secondary | ICD-10-CM

## 2015-04-05 DIAGNOSIS — I10 Essential (primary) hypertension: Secondary | ICD-10-CM

## 2015-04-05 DIAGNOSIS — I2510 Atherosclerotic heart disease of native coronary artery without angina pectoris: Secondary | ICD-10-CM | POA: Diagnosis not present

## 2015-04-05 DIAGNOSIS — E785 Hyperlipidemia, unspecified: Secondary | ICD-10-CM

## 2015-04-05 NOTE — Patient Instructions (Addendum)
Medication Instructions:  Please hold your Amlodipine 5 mg a day.  Monitor blood pressures and if elevated - please call. Continue all other medications as listed.   Follow-Up: Please schedule an appointment with Fuller Canada in the Hypertension Clinic in 1 month. Follow up in 1 year with Dr. Marlou Porch.  You will receive a letter in the mail 2 months before you are due.  Please call us when you receive this letter to schedule your follow up appointment.  If you need a refill on your cardiac medications before your next appointment, please call your pharmacy.  Thank you for choosing [facility]!!

## 2015-04-05 NOTE — Progress Notes (Signed)
[place]. [address], [address] [place], [place]  [address] Phone: [phone] Fax:  [phone]  Date:  04/05/2015   ID:  Zachary Hamilton, DOB [birth year], MRN [id number]  PCP:   Melinda Crutch, MD   History of Present Illness: Zachary Hamilton is a 55 y.o. male underwent cardiac catheterization 08/12/12 with a family history of father having heart attack age 61 with previous CT angiogram demonstrated a 50-75% distal LAD lesion experienced unstable angina/rest anginal symptoms here for followup. His cardiac catheterization demonstrated a mid LAD lesion of approximately 50%, eccentric plaque is seen in the LAO cranial view but in other views appeared to be nonflow limiting. He had mild luminal irregularities in the proximal hiatus marginal branch as well as distal RCA. His EF was 55%, normal.  He has had statin intolerances in the past- Simvastatin was changed to atorvastatin after he was wondering if muscle discomfort was coming from this. Seems to be tolerating Crestor 5 , 3 times a week well.  Low-dose isosorbide to see if helps with his symptoms.   GERD like symptoms. Hard to distinguish between "real chest pain " Pains are in the hotspots of chest in arms and bilateral chest. They do not appear to be exertional. No associated shortness of breath or diaphoresis. Sometimes it feels as though they were on the outside of the chest wall.   04/05/15 - Omron at home. 130/65 three time a day. Weight loss. Excellent blood pressures at home. Recently been working with Jinny Blossom Supple in the hypertension clinic. He thinks he may be having ED side effect with amlodipine and would actually like to try to come off of this and continue with the losartan/hydrochlorothiazide. He believes that he has done very well on this medication. Denies any chest pain, shortness of breath, syncope, bleeding. He has lost approximate 15 pounds from decreasing carbohydrates.  Wt Readings from Last 3 Encounters:  04/05/15 246  lb (111.585 kg)  02/25/15 261 lb 12.8 oz (118.752 kg)  12/01/14 253 lb 6.4 oz (114.941 kg)     Past Medical History  Diagnosis Date  . Dysphagia   . Chronic kidney disease   . GERD (gastroesophageal reflux disease)   . Coronary artery disease     CTA 09/03/11 - 50-75% distal LAD, calcified prox LAD. NUC stress 09/05/11 - no flow limiting dz.   . Hypercholesteremia     Past Surgical History  Procedure Laterality Date  . Nasal sinus surgery  2000  . Esophagogastroduodenoscopy  02/10/2011    Procedure: ESOPHAGOGASTRODUODENOSCOPY (EGD);  Surgeon: Jeryl Columbia, MD;  Location: [facility];  Service: Endoscopy;  Laterality: N/A;  . Savory dilation  02/10/2011    Procedure: SAVORY DILATION;  Surgeon: Jeryl Columbia, MD;  Location: [facility];  Service: Endoscopy;  Laterality: N/A;  . Left heart catheterization with coronary angiogram Bilateral 08/12/2012    Procedure: LEFT HEART CATHETERIZATION WITH CORONARY ANGIOGRAM;  Surgeon: Candee Furbish, MD;  Location: [facility] CATH LAB;  Service: Cardiovascular;  Laterality: Bilateral;    Current Outpatient Prescriptions  Medication Sig Dispense Refill  . amLODipine (NORVASC) 10 MG tablet Take 1 tablet (10 mg total) by mouth daily. 30 tablet 11  . Coenzyme Q10 300 MG CAPS Take 1 capsule by mouth daily.    . isosorbide mononitrate (IMDUR) 30 MG 24 hr tablet TAKE 1 TABLET BY MOUTH EVERY DAY 30 tablet 11  . losartan-hydrochlorothiazide (HYZAAR) 50-12.5 MG tablet Take 1 tablet by mouth daily. [place]  tablet 5  . nitroGLYCERIN (NITROSTAT) 0.4 MG SL tablet Place 1 tablet (0.4 mg total) under the tongue every 5 (five) minutes x 3 doses as needed for chest pain. 30 tablet 12  . omeprazole (PRILOSEC) 20 MG capsule Take 20 mg by mouth daily.    . rosuvastatin (CRESTOR) 5 MG tablet Take 5 mg by mouth 3 (three) times a week.     No current facility-administered medications for this visit.    Allergies:   No Known Allergies  Social History:  The patient  reports that he  has never smoked. He has never used smokeless tobacco. He reports that he drinks about 0.6 oz of alcohol per week. He reports that he does not use illicit drugs.   ROS:  Please see the history of present illness.   Denies any fevers, chills, orthopnea, PND, syncope, chest pain   All other systems reviewed and negative.   PHYSICAL EXAM: VS:  BP 174/76 mmHg  Pulse 68  Ht 6' (1.829 m)  Wt 246 lb (111.585 kg)  BMI 33.36 kg/m2 Well nourished, well developed, in no acute distress HEENT: normal Neck: no JVD Cardiac:  normal S1, S2; RRR; no murmur Lungs:  clear to auscultation bilaterally, no wheezing, rhonchi or rales Abd: soft, nontender, no hepatomegaly Ext: no edema Skin: warm and dry Neuro: no focal abnormalities noted  EKG: Today 12/01/14-sinus rhythm, 68, no other abnormalities personally viewed.  Cardiac catheterization as reviewed above    ASSESSMENT AND PLAN:  1. Atypical chest pain-bilateral at times, fairly nondescript. Nonexertional. Nonetheless, he did have a 50% LAD lesion. Nuclear stress 02/04/15 with very minimal ischemia, overall low risk. Continuing with medical management. 2. Coronary artery disease-eccentric LAD plaque as described above. In the past he has had statin intolerances, atorvastatin, simvastatin. He is taking  Crestor  5 mg 3 times a week. 3. Obesity-encourage weight loss. Continue. Discussed at length. Good Job with 15 pounds.  4. GERD-PPI, omeprazole. 5. Hypertension- elevated today but at home OK. He admits that he may have a degree of whitecoat hypertension. If his blood pressure remains greater than 140/90 consistently, I think doubling his losartan hydrochlorothiazide would be helpful from 50/12.5-100/25. He wanted to come off of the amlodipine and had never increased it to the 10 mg dosage from 5. Concerned about potential ED side effect. I instructed him to go ahead and stop amlodipine, continue to take his blood pressures at home. If increased, go ahead  and double up on his losartan hydrochlorothiazide. He will be seeing Fuller Canada and clinic soon. 6. 1 month follow up with Jinny Blossom, 1 year with me.   Signed, Candee Furbish, MD [facility]  04/05/2015 9:07 AM

## 2015-04-14 ENCOUNTER — Other Ambulatory Visit: Payer: Self-pay | Admitting: Cardiology

## 2015-05-24 ENCOUNTER — Ambulatory Visit: Payer: BC Managed Care – PPO | Admitting: Pharmacist

## 2015-08-12 ENCOUNTER — Other Ambulatory Visit: Payer: Self-pay | Admitting: Cardiology

## 2015-11-16 ENCOUNTER — Other Ambulatory Visit: Payer: Self-pay | Admitting: Cardiology

## 2016-02-04 ENCOUNTER — Other Ambulatory Visit: Payer: Self-pay | Admitting: Cardiology

## 2016-03-29 ENCOUNTER — Other Ambulatory Visit: Payer: Self-pay | Admitting: Cardiology

## 2016-03-31 ENCOUNTER — Other Ambulatory Visit: Payer: Self-pay | Admitting: Cardiology

## 2016-04-04 ENCOUNTER — Encounter: Payer: Self-pay | Admitting: Cardiology

## 2016-04-04 ENCOUNTER — Ambulatory Visit (INDEPENDENT_AMBULATORY_CARE_PROVIDER_SITE_OTHER): Payer: BC Managed Care – PPO | Admitting: Cardiology

## 2016-04-04 VITALS — BP 160/98 | HR 75 | Ht 72.0 in | Wt 262.2 lb

## 2016-04-04 DIAGNOSIS — I2583 Coronary atherosclerosis due to lipid rich plaque: Secondary | ICD-10-CM

## 2016-04-04 DIAGNOSIS — I2510 Atherosclerotic heart disease of native coronary artery without angina pectoris: Secondary | ICD-10-CM

## 2016-04-04 DIAGNOSIS — I10 Essential (primary) hypertension: Secondary | ICD-10-CM

## 2016-04-04 DIAGNOSIS — E7800 Pure hypercholesterolemia, unspecified: Secondary | ICD-10-CM | POA: Diagnosis not present

## 2016-04-04 MED ORDER — ISOSORBIDE MONONITRATE ER 30 MG PO TB24: 30.0000 mg | ORAL_TABLET | Freq: Every day | ORAL | 3 refills | Status: DC

## 2016-04-04 MED ORDER — LOSARTAN POTASSIUM-HCTZ 100-25 MG PO TABS: 1.0000 | ORAL_TABLET | Freq: Every day | ORAL | 3 refills | Status: DC

## 2016-04-04 NOTE — Patient Instructions (Addendum)
Medication Instructions:  Please increase your Losartan to100/25 mg a day. Continue all other medications as listed.  Follow-Up: Follow up in 1 year with Dr. Marlou Porch.  You will receive a letter in the mail 2 months before you are due.  Please call us when you receive this letter to schedule your follow up appointment.  If you need a refill on your cardiac medications before your next appointment, please call your pharmacy.  Thank you for choosing [facility]!!

## 2016-04-04 NOTE — Progress Notes (Signed)
[place]. [address] [place], [place]  [address] Phone: [phone] Fax:  [phone]  Date:  04/04/2016   ID:  Zachary Hamilton, DOB 05-Apr-1960, MRN [id number]  PCP:  Melinda Crutch, MD   History of Present Illness: Zachary Hamilton is a 56 y.o. male underwent cardiac catheterization 08/12/12 with a family history of father having heart attack age 25 with previous CT angiogram demonstrated a 50-75% distal LAD lesion experienced unstable angina/rest anginal symptoms here for followup. His cardiac catheterization demonstrated a mid LAD lesion of approximately 50%, eccentric plaque is seen in the LAO cranial view but in other views appeared to be nonflow limiting. He had mild luminal irregularities in the proximal hiatus marginal branch as well as distal RCA. His EF was 55%, normal.  He has had statin intolerances in the past- Simvastatin was changed to atorvastatin after he was wondering if muscle discomfort was coming from this. Seems to be tolerating Crestor 5 , 3 times a week well.  Low-dose isosorbide to see if helps with his symptoms.   GERD like symptoms. Hard to distinguish between "real chest pain " Pains are in the hotspots of chest in arms and bilateral chest. They do not appear to be exertional. No associated shortness of breath or diaphoresis. Sometimes it feels as though they were on the outside of the chest wall.   04/05/15 - Omron at home. 130/65 three time a day. Weight loss. Excellent blood pressures at home. Recently been working with Jinny Blossom Supple in the hypertension clinic. He thinks he may be having ED side effect with amlodipine and would actually like to try to come off of this and continue with the losartan/hydrochlorothiazide. He believes that he has done very well on this medication. Denies any chest pain, shortness of breath, syncope, bleeding. He has lost approximate 15 pounds from decreasing carbohydrates.  04/04/16 - once again, at home blood pressures have  been running in the 1:30 range. Here there are higher. His mother was the same way and she was an Therapist, sports. 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 No chest pain, no shortness of breath, no syncope.  Amlodipine-off ED side effect Toprol-off "foggy"  He seems to have no side effects with the Imdur nor the ARB HCT combination  Wt Readings from Last 3 Encounters:  04/04/16 262 lb 3.2 oz (118.9 kg)  04/05/15 246 lb (111.6 kg)  02/25/15 261 lb 12.8 oz (118.8 kg)     Past Medical History:  Diagnosis Date  . Chronic kidney disease   . Coronary artery disease    CTA 09/03/11 - 50-75% distal LAD, calcified prox LAD. NUC stress 09/05/11 - no flow limiting dz.   Marland Kitchen Dysphagia   . GERD (gastroesophageal reflux disease)   . Hypercholesteremia     Past Surgical History:  Procedure Laterality Date  . ESOPHAGOGASTRODUODENOSCOPY  02/10/2011   Procedure: ESOPHAGOGASTRODUODENOSCOPY (EGD);  Surgeon: Jeryl Columbia, MD;  Location: [facility];  Service: Endoscopy;  Laterality: N/A;  . LEFT HEART CATHETERIZATION WITH CORONARY ANGIOGRAM Bilateral 08/12/2012   Procedure: LEFT HEART CATHETERIZATION WITH CORONARY ANGIOGRAM;  Surgeon: Candee Furbish, MD;  Location: [facility] CATH LAB;  Service: Cardiovascular;  Laterality: Bilateral;  . NASAL SINUS SURGERY  2000  . SAVORY DILATION  02/10/2011   Procedure: SAVORY DILATION;  Surgeon: Jeryl Columbia, MD;  Location: [facility] ENDOS[facility];  Service: Endoscopy;  Laterality: N/A;  Current Outpatient Prescriptions  Medication Sig Dispense Refill  . Coenzyme Q10 300 MG CAPS Take 1 capsule by mouth daily.    . isosorbide mononitrate (IMDUR) 30 MG 24 hr tablet Take 1 tablet (30 mg total) by mouth daily. 90 tablet 3  . nitroGLYCERIN (NITROSTAT) 0.4 MG SL tablet Place 1 tablet (0.4 mg total) under the tongue every 5 (five) minutes x 3 doses as needed for chest pain. 30 tablet 12  . omeprazole (PRILOSEC) 20 MG capsule Take 20 mg by mouth  daily.    . rosuvastatin (CRESTOR) 5 MG tablet Take 1 tablet by mouth 3 times weekly. 15 tablet 0  . losartan-hydrochlorothiazide (HYZAAR) 100-25 MG tablet Take 1 tablet by mouth daily. 90 tablet 3   No current facility-administered medications for this visit.     Allergies:   No Known Allergies  Social History:  The patient  reports that he has never smoked. He has never used smokeless tobacco. He reports that he drinks about 0.6 oz of alcohol per week . He reports that he does not use drugs.   ROS:  Please see the history of present illness.   Denies any fevers, chills, orthopnea, PND, syncope, chest pain   All other systems reviewed and negative.   PHYSICAL EXAM: VS:  BP (!) 160/98   Pulse 75   Ht 6' (1.829 m)   Wt 262 lb 3.2 oz (118.9 kg)   BMI 35.56 kg/m  Well nourished, well developed, in no acute distress  HEENT: normal  Neck: no JVD  Cardiac:  normal S1, S2; RRR; no murmur  Lungs:  clear to auscultation bilaterally, no wheezing, rhonchi or rales , weight Abd: soft, nontender, no hepatomegaly  Ext: no edema  Skin: warm and dry  Neuro: no focal abnormalities noted  EKG: Today 04/04/16-sinus rhythm 75 with no other abnormalities personally viewed-prior 12/01/14-sinus rhythm, 68, no other abnormalities personally viewed.  Cardiac catheterization as reviewed above    ASSESSMENT AND PLAN:  1. Atypical chest pain-bilateral at times, fairly nondescript. Nonexertional. Nonetheless, he did have a 50% LAD lesion. Nuclear stress 02/04/15 with very minimal ischemia, overall low risk. Continuing with medical management. 2. Coronary artery disease-eccentric LAD plaque as described above. In the past he has had statin intolerances, atorvastatin, simvastatin. He is taking  Crestor  5 mg twice a week. 3. Obesity-encourage weight loss. Continue. Discussed at length. Decreased 20 pounds now all back..  4. GERD-PPI, omeprazole. 5. Hypertension- previous visit with Fuller Canada, [place]  reviewed. He had "foggy headed "sensation since taking Toprol. This was discontinued. His amlodipine was increased to 10 mg. This was stopped, ED. Continuing losartan HCTZ. elevated today but at home OK. He admits that he may have a degree of whitecoat hypertension. 6. One-year follow-up.   Signed, Candee Furbish, MD [facility]  04/04/2016 4:50 PM

## 2016-04-06 ENCOUNTER — Other Ambulatory Visit (INDEPENDENT_AMBULATORY_CARE_PROVIDER_SITE_OTHER): Payer: BC Managed Care – PPO | Admitting: *Deleted

## 2016-04-06 DIAGNOSIS — R0789 Other chest pain: Secondary | ICD-10-CM

## 2016-04-06 DIAGNOSIS — I10 Essential (primary) hypertension: Secondary | ICD-10-CM | POA: Diagnosis not present

## 2016-04-30 ENCOUNTER — Other Ambulatory Visit: Payer: Self-pay | Admitting: Cardiology

## 2017-02-22 ENCOUNTER — Other Ambulatory Visit: Payer: Self-pay | Admitting: Cardiology

## 2017-03-18 ENCOUNTER — Other Ambulatory Visit: Payer: Self-pay | Admitting: Cardiology

## 2017-03-30 ENCOUNTER — Other Ambulatory Visit: Payer: Self-pay | Admitting: Cardiology

## 2017-04-09 ENCOUNTER — Other Ambulatory Visit: Payer: Self-pay | Admitting: Cardiology

## 2017-04-23 ENCOUNTER — Ambulatory Visit: Payer: BC Managed Care – PPO | Admitting: Cardiology

## 2017-04-28 ENCOUNTER — Other Ambulatory Visit: Payer: Self-pay | Admitting: Cardiology

## 2017-05-18 ENCOUNTER — Ambulatory Visit (INDEPENDENT_AMBULATORY_CARE_PROVIDER_SITE_OTHER): Payer: BC Managed Care – PPO | Admitting: Cardiology

## 2017-05-18 ENCOUNTER — Encounter: Payer: Self-pay | Admitting: Cardiology

## 2017-05-18 VITALS — BP 162/90 | HR 74 | Ht 72.0 in | Wt 251.2 lb

## 2017-05-18 DIAGNOSIS — I10 Essential (primary) hypertension: Secondary | ICD-10-CM | POA: Diagnosis not present

## 2017-05-18 DIAGNOSIS — I2510 Atherosclerotic heart disease of native coronary artery without angina pectoris: Secondary | ICD-10-CM

## 2017-05-18 DIAGNOSIS — I2583 Coronary atherosclerosis due to lipid rich plaque: Secondary | ICD-10-CM

## 2017-05-18 DIAGNOSIS — R0789 Other chest pain: Secondary | ICD-10-CM

## 2017-05-18 DIAGNOSIS — E7800 Pure hypercholesterolemia, unspecified: Secondary | ICD-10-CM

## 2017-05-18 NOTE — Progress Notes (Signed)
[place] [facility]. [address] [place], [place]  [address] Phone: [phone] Fax:  [phone]  Date:  05/18/2017   ID:  Zachary Hamilton, DOB [birth year], MRN [id number]  PCP:  Lawerance Cruel, MD   History of Present Illness: Zachary Hamilton is a 57 y.o. male underwent cardiac catheterization 08/12/12 with a family history of father having heart attack age 37 with previous CT angiogram demonstrated a 50-75% distal LAD lesion experienced unstable angina/rest anginal symptoms here for followup. His cardiac catheterization demonstrated a mid LAD lesion of approximately 50%, eccentric plaque is seen in the LAO cranial view but in other views appeared to be nonflow limiting. He had mild luminal irregularities in the proximal hiatus marginal branch as well as distal RCA. His EF was 55%, normal.  He has had statin intolerances in the past- Simvastatin was changed to atorvastatin after he was wondering if muscle discomfort was coming from this. Seems to be tolerating Crestor 5 , 3 times a week well.  Low-dose isosorbide to see if helps with his symptoms.   GERD like symptoms. Hard to distinguish between "real chest pain " Pains are in the hotspots of chest in arms and bilateral chest. They do not appear to be exertional. No associated shortness of breath or diaphoresis. Sometimes it feels as though they were on the outside of the chest wall.   04/05/15 - Omron at home. 130/65 three time a day. Weight loss. Excellent blood pressures at home. Recently been working with Jinny Blossom Supple in the hypertension clinic. He thinks he may be having ED side effect with amlodipine and would actually like to try to come off of this and continue with the losartan/hydrochlorothiazide. He believes that he has done very well on this medication. Denies any chest pain, shortness of breath, syncope, bleeding. He has lost approximate 15 pounds from decreasing carbohydrates.  04/04/16 - once again, at home blood pressures  have been running in the 1:30 range. Here there are higher. His mother was the same way and she was an Therapist, sports. 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 No chest pain, no shortness of breath, no syncope.  Amlodipine-off ED side effect Toprol-off "foggy"  He seems to have no side effects with the Imdur nor the ARB HCT combination  05/18/17-overall he has been doing quite well with his blood pressures at home.  When he comes into the doctor's office it usually is a bit elevated. palps 150.  Occasional aches and pains, Tylenol or ibuprofen.  We discussed weight loss.  We discussed apple watch.  Every 2 or 3 months he may have an episode of racing heartbeat which is fit bit is telling him is going 150 bpm.  Discussed the differential.  He is a Technical sales engineer, [employer].  He also coaches baseball.  Wt Readings from Last 3 Encounters:  05/18/17 251 lb 3.2 oz (113.9 kg)  04/04/16 262 lb 3.2 oz (118.9 kg)  04/05/15 246 lb (111.6 kg)     Past Medical History:  Diagnosis Date  . Chronic kidney disease   . Coronary artery disease    CTA 09/03/11 - 50-75% distal LAD, calcified prox LAD. NUC stress 09/05/11 - no flow limiting dz.   Marland Kitchen Dysphagia   . GERD (gastroesophageal reflux disease)   . Hypercholesteremia     Past Surgical History:  Procedure Laterality Date  . ESOPHAGOGASTRODUODENOSCOPY  02/10/2011  Procedure: ESOPHAGOGASTRODUODENOSCOPY (EGD);  Surgeon: Jeryl Columbia, MD;  Location: [facility];  Service: Endoscopy;  Laterality: N/A;  . LEFT HEART CATHETERIZATION WITH CORONARY ANGIOGRAM Bilateral 08/12/2012   Procedure: LEFT HEART CATHETERIZATION WITH CORONARY ANGIOGRAM;  Surgeon: Candee Furbish, MD;  Location: [facility] CATH LAB;  Service: Cardiovascular;  Laterality: Bilateral;  . NASAL SINUS SURGERY  2000  . SAVORY DILATION  02/10/2011   Procedure: SAVORY DILATION;  Surgeon: Jeryl Columbia, MD;  Location: [facility]OS[facility];  Service:  Endoscopy;  Laterality: N/A;    Current Outpatient Medications  Medication Sig Dispense Refill  . Coenzyme Q10 300 MG CAPS Take 1 capsule by mouth daily.    . isosorbide mononitrate (IMDUR) 30 MG 24 hr tablet TAKE 1 TABLET (30 MG TOTAL) BY MOUTH DAILY. 90 tablet 0  . losartan-hydrochlorothiazide (HYZAAR) 100-25 MG tablet Take 1 tablet by mouth daily. Please keep upcoming appt for future refills. Thank you 90 tablet 0  . nitroGLYCERIN (NITROSTAT) 0.4 MG SL tablet Place 1 tablet (0.4 mg total) under the tongue every 5 (five) minutes x 3 doses as needed for chest pain. 30 tablet 12  . omeprazole (PRILOSEC) 20 MG capsule Take 20 mg by mouth daily.    . rosuvastatin (CRESTOR) 5 MG tablet Take 1 tablet (5 mg total) by mouth 3 (three) times a week. Please keep upcoming appt for future refills. Thank you 15 tablet 0   No current facility-administered medications for this visit.     Allergies:   No Known Allergies  Social History:  The patient  reports that he has never smoked. He has never used smokeless tobacco. He reports that he drinks about 0.6 oz of alcohol per week. He reports that he does not use drugs.   ROS:  Please see the history of present illness.   No chest pain fevers chills nausea vomiting syncope bleeding.  Occasional palpitations noted as above.  All other review of systems negative.  PHYSICAL EXAM: VS:  BP (!) 162/90   Pulse 74   Ht 6' (1.829 m)   Wt 251 lb 3.2 oz (113.9 kg)   BMI 34.07 kg/m  GEN: Well nourished, well developed, in no acute distress  HEENT: normal  Neck: no JVD, carotid bruits, or masses Cardiac: RRR; no murmurs, rubs, or gallops,no edema  Respiratory:  clear to auscultation bilaterally, normal work of breathing GI: soft, nontender, nondistended, + BS MS: no deformity or atrophy  Skin: warm and dry, no rash Neuro:  Alert and Oriented x 3, Strength and sensation are intact Psych: euthymic mood, full affect  EKG: Today 04/04/16-sinus rhythm 75 with no  other abnormalities personally viewed-prior 12/01/14-sinus rhythm, 68, no other abnormalities personally viewed.  Cardiac catheterization as reviewed above    ASSESSMENT AND PLAN:  1. Atypical chest pain- no real complaints of this during this office visit.  In the past had been bilateral pain at times, fairly nondescript. Nonexertional. Nonetheless, he did have a 50% LAD lesion. Nuclear stress 02/04/15 with very minimal ischemia, overall low risk. Continuing with medical management. 2. Coronary artery disease-eccentric LAD plaque as described above. In the past he has had statin intolerances, atorvastatin, simvastatin. He is taking  Crestor  5 mg twice a week.  Continue to encourage. 3. Obesity-encourage weight loss. Continue. Discussed at length. Decreased 20 pounds now all back.  He is motivated to lose 50 pounds. 4. GERD-PPI, omeprazole.  No changes made. 5. Hypertension- previous visit with Fuller Canada, [place] reviewed. He had "foggy headed "sensation  since taking Toprol. This was discontinued. His amlodipine was increased to 10 mg. This was stopped, ED. Continuing losartan HCTZ. elevated today but at home OK. He admits that he may have a degree of whitecoat hypertension.  No changes made today.  If his lot of losartan goes on recall, we can switch him to lisinopril hydrochlorothiazide.  He has not tried lisinopril in the past to his knowledge. 6. One-year follow-up.   Signed, Candee Furbish, MD [facility]  05/18/2017 10:31 AM

## 2017-05-18 NOTE — Patient Instructions (Signed)

## 2017-05-19 ENCOUNTER — Other Ambulatory Visit: Payer: Self-pay | Admitting: Cardiology

## 2017-06-27 ENCOUNTER — Other Ambulatory Visit: Payer: Self-pay | Admitting: Cardiology

## 2017-07-02 ENCOUNTER — Other Ambulatory Visit: Payer: Self-pay | Admitting: Cardiology

## 2018-05-24 DIAGNOSIS — M19041 Primary osteoarthritis, right hand: Secondary | ICD-10-CM | POA: Insufficient documentation

## 2018-06-03 ENCOUNTER — Telehealth: Payer: Self-pay | Admitting: *Deleted

## 2018-06-03 NOTE — Telephone Encounter (Signed)
   Pt gives verbal consent for virtual visit.  Aware of date and time.  TELEPHONE CALL NOTE  This patient has been deemed a candidate for follow-up tele-health visit to limit community exposure during the Covid-19 pandemic. I spoke with the patient via phone to discuss instructions. This has been outlined on the patient's AVS (dotphrase: [email or web address]). The patient was advised to review the section on consent for treatment as well. The patient will receive a phone call 2-3 days prior to their E-Visit at which time consent will be verbally confirmed. A Virtual Office Visit appointment type has been scheduled for 4/22 with Dr Marlou Porch, with "VIDEO" or "TELEPHONE" in the appointment notes - patient prefers VIDEO  type.  I have either confirmed the patient is active in MyChart or offered to send sign-up link to phone/email via Mychart icon beside patient's photo.  Ellwood Dense, RN 06/03/2018 2:39 PM

## 2018-06-05 ENCOUNTER — Telehealth (INDEPENDENT_AMBULATORY_CARE_PROVIDER_SITE_OTHER): Payer: BC Managed Care – PPO | Admitting: Cardiology

## 2018-06-05 ENCOUNTER — Other Ambulatory Visit: Payer: Self-pay

## 2018-06-05 ENCOUNTER — Encounter: Payer: Self-pay | Admitting: Cardiology

## 2018-06-05 VITALS — BP 134/67 | HR 60 | Ht 72.0 in | Wt 235.0 lb

## 2018-06-05 DIAGNOSIS — R0789 Other chest pain: Secondary | ICD-10-CM

## 2018-06-05 DIAGNOSIS — I2510 Atherosclerotic heart disease of native coronary artery without angina pectoris: Secondary | ICD-10-CM

## 2018-06-05 DIAGNOSIS — I10 Essential (primary) hypertension: Secondary | ICD-10-CM

## 2018-06-05 DIAGNOSIS — R002 Palpitations: Secondary | ICD-10-CM

## 2018-06-05 DIAGNOSIS — I2583 Coronary atherosclerosis due to lipid rich plaque: Secondary | ICD-10-CM

## 2018-06-05 DIAGNOSIS — E7800 Pure hypercholesterolemia, unspecified: Secondary | ICD-10-CM

## 2018-06-05 NOTE — Patient Instructions (Signed)
Medication Instructions:  Your provider recommends that you continue on your current medications as directed. Please refer to the Current Medication list given to you today.    Labwork: None  Testing/Procedures: None  Follow-Up: Your provider wants you to follow-up in: 1 year with Dr. Marlou Porch. You will receive a reminder letter in the mail two months in advance. If you don't receive a letter, please call our office to schedule the follow-up appointment.    If you need a refill on your cardiac medications before your next appointment, please call your pharmacy.

## 2018-06-05 NOTE — Progress Notes (Signed)
Virtual Visit via Video Note   This visit type was conducted due to national recommendations for restrictions regarding the COVID-19 Pandemic (e.g. social distancing) in an effort to limit this patient's exposure and mitigate transmission in our community.  Due to his co-morbid illnesses, this patient is at least at moderate risk for complications without adequate follow up.  This format is felt to be most appropriate for this patient at this time.  All issues noted in this document were discussed and addressed.  A limited physical exam was performed with this format.  Please refer to the patient's chart for his consent to telehealth for [facility].   Evaluation Performed:  Follow-up visit  Date:  06/05/2018   ID:  Zachary Hamilton, DOB April 12, 1960, MRN [id number]  Patient Location: Home Provider Location: Home  PCP:  Lawerance Cruel, MD  Cardiologist:  Candee Furbish, MD  Electrophysiologist:  None   Chief Complaint: Coronary artery disease follow-up  History of Present Illness:    Zachary Hamilton is a 58 y.o. male with coronary artery disease, prior cardiac catheterization 2014 demonstrated a mid LAD lesion of approximately 50%, non-flow-limiting with a EF of 55%.  Prior issues with statin intolerance but seems to be tolerating Crestor.  Had trouble with simvastatin and atorvastatin.  Difficult in the past to distinguish between real chest pain and arm pain.  Blood pressure has been an issue for him in the past.  Amlodipine and Toprol caused side effects.  Occasionally will have paroxysmal tachycardia, very rare.  He only had 2 episodes since her last visit.  Sometimes while sitting down his heart rate can increase to may be 150 but this is short-lived.  We discussed this in the past.  The patient does not have symptoms concerning for COVID-19 infection (fever, chills, cough, or new shortness of breath).    Past Medical History:  Diagnosis Date  . Chronic kidney disease   .  Coronary artery disease    CTA 09/03/11 - 50-75% distal LAD, calcified prox LAD. NUC stress 09/05/11 - no flow limiting dz.   Marland Kitchen Dysphagia   . GERD (gastroesophageal reflux disease)   . Hypercholesteremia    Past Surgical History:  Procedure Laterality Date  . ESOPHAGOGASTRODUODENOSCOPY  02/10/2011   Procedure: ESOPHAGOGASTRODUODENOSCOPY (EGD);  Surgeon: Jeryl Columbia, MD;  Location: [facility];  Service: Endoscopy;  Laterality: N/A;  . LEFT HEART CATHETERIZATION WITH CORONARY ANGIOGRAM Bilateral 08/12/2012   Procedure: LEFT HEART CATHETERIZATION WITH CORONARY ANGIOGRAM;  Surgeon: Candee Furbish, MD;  [facility];  Service: Cardiovascular;  Laterality: Bilateral;  . NASAL SINUS SURGERY  2000  . SAVORY DILATION  02/10/2011   Procedure: SAVORY DILATION;  Surgeon: Jeryl Columbia, MD;  Location: [facility] ENDOSCOPY;  Service: Endoscopy;  Laterality: N/A;     Current Meds  Medication Sig  . Coenzyme Q10 300 MG CAPS Take 1 capsule by mouth daily.  Marland Kitchen DICLOSAICIN 1.5 & 0.025 % THPK Apply on skin as directed  . isosorbide mononitrate (IMDUR) 30 MG 24 hr tablet TAKE 1 TABLET BY MOUTH EVERY DAY  . LIDOZION 3 % LOTN Apply on to skin as directed  . losartan-hydrochlorothiazide (HYZAAR) 100-25 MG tablet TAKE 1 TABLET BY MOUTH DAILY.  . nitroGLYCERIN (NITROSTAT) 0.4 MG SL tablet Place 1 tablet (0.4 mg total) under the tongue every 5 (five) minutes x 3 doses as needed for chest pain.  Marland Kitchen omeprazole (PRILOSEC) 20 MG capsule Take 20 mg by mouth daily.  . rosuvastatin (  CRESTOR) 5 MG tablet Take 1 tablet (5 mg total) by mouth 3 (three) times a week.     Allergies:   Patient has no known allergies.   Social History   Tobacco Use  . Smoking status: Never Smoker  . Smokeless tobacco: Never Used  . Tobacco comment: grew up with a smoker  Substance Use Topics  . Alcohol use: Yes    Alcohol/week: 1.0 standard drinks    Types: 1 Cans of beer per week  . Drug use: No     Family Hx: The patient's family  history includes Breast cancer in his sister; Diabetes in his mother; Heart attack in his father. There is no history of Anesthesia problems.  ROS:   Please see the history of present illness.    Denies any fevers chills cough chest pain shortness of breath.  Very rare heart palpitations. All other systems reviewed and are negative.   Prior CV studies:   The following studies were reviewed today:  Prior cardiac catheterization reviewed- 50% LAD stenosis.  Labs/Other Tests and Data Reviewed:    EKG:  An ECG dated 05/18/2017 was personally reviewed today and demonstrated:  Sinus rhythm with borderline first-degree AV block, poor R wave progression  Recent Labs: No results found for requested labs within last 8760 hours.   Recent Lipid Panel Lab Results  Component Value Date/Time   CHOL 178 02/04/2015 08:50 AM   TRIG 92 02/04/2015 08:50 AM   HDL 46 02/04/2015 08:50 AM   CHOLHDL 3.9 02/04/2015 08:50 AM   LDLCALC 114 02/04/2015 08:50 AM    Wt Readings from Last 3 Encounters:  06/05/18 235 lb (106.6 kg)  05/18/17 251 lb 3.2 oz (113.9 kg)  04/04/16 262 lb 3.2 oz (118.9 kg)     Objective:    Vital Signs:  BP 134/67 (BP Location: Left Arm)   Pulse 60   Ht 6' (1.829 m)   Wt 235 lb (106.6 kg)   BMI 31.87 kg/m    VITAL SIGNS:  reviewed GEN:  no acute distress EYES:  sclerae anicteric, EOMI - Extraocular Movements Intact RESPIRATORY:  normal respiratory effort, symmetric expansion CARDIOVASCULAR:  no peripheral edema SKIN:  no rash, lesions or ulcers. MUSCULOSKELETAL:  no obvious deformities. NEURO:  alert and oriented x 3, no obvious focal deficit PSYCH:  normal affect  ASSESSMENT & PLAN:    Coronary artery disease - Nonobstructive LAD disease 50%.  Nuclear stress test with overall low risk in 2016.  Aggressive secondary risk factor prevention.  He has had some atypical chest pain in the past.  Overall is been doing quite well.  Taking low-dose Crestor for plaque  stabilization.  Has had trouble with statin intolerance in the past.  Obesity - Continue to encourage weight loss.  Good job.  He is down 30 pounds since 2 years ago.  Usually this time of year he is good about losing more weight since it is baseball season.  Essential hypertension - Previously has worked with pharmacy team.  There was some degree of whitecoat hypertension.  Palpitations - Occasional rapid heart rate at rest.  Very rare.  Only happened twice in the past year.  He will continue to monitor this.  If this worsens, we will have low threshold for monitoring him.  Suggested the possibility of apple watch as well.  COVID-19 Education: The signs and symptoms of COVID-19 were discussed with the patient and how to seek care for testing (follow up with PCP or arrange  E-visit).  The importance of social distancing was discussed today.  Time:   Today, I have spent 17 minutes with the patient with telehealth technology discussing the above problems.     Medication Adjustments/Labs and Tests Ordered: Current medicines are reviewed at length with the patient today.  Concerns regarding medicines are outlined above.   Tests Ordered: No orders of the defined types were placed in this encounter.   Medication Changes: No orders of the defined types were placed in this encounter.   Disposition:  Follow up in 1 year(s)  Signed, Candee Furbish, MD  06/05/2018 11:25 AM    [facility]

## 2018-06-07 ENCOUNTER — Ambulatory Visit: Payer: BC Managed Care – PPO | Admitting: Cardiology

## 2018-06-26 ENCOUNTER — Other Ambulatory Visit: Payer: Self-pay | Admitting: Orthopedic Surgery

## 2018-06-26 ENCOUNTER — Other Ambulatory Visit: Payer: Self-pay | Admitting: Cardiology

## 2018-06-26 DIAGNOSIS — M6740 Ganglion, unspecified site: Secondary | ICD-10-CM | POA: Insufficient documentation

## 2018-07-03 ENCOUNTER — Other Ambulatory Visit: Payer: Self-pay

## 2018-07-03 ENCOUNTER — Encounter (HOSPITAL_BASED_OUTPATIENT_CLINIC_OR_DEPARTMENT_OTHER): Payer: Self-pay | Admitting: *Deleted

## 2018-07-05 ENCOUNTER — Encounter (HOSPITAL_BASED_OUTPATIENT_CLINIC_OR_DEPARTMENT_OTHER)
Admission: RE | Admit: 2018-07-05 | Discharge: 2018-07-05 | Disposition: A | Discharge status: Home / Self Care | Payer: BC Managed Care – PPO | Source: Ambulatory Visit | Attending: Orthopedic Surgery | Admitting: Orthopedic Surgery

## 2018-07-05 ENCOUNTER — Other Ambulatory Visit: Payer: Self-pay

## 2018-07-05 DIAGNOSIS — I2510 Atherosclerotic heart disease of native coronary artery without angina pectoris: Secondary | ICD-10-CM | POA: Insufficient documentation

## 2018-07-05 DIAGNOSIS — I10 Essential (primary) hypertension: Secondary | ICD-10-CM | POA: Diagnosis not present

## 2018-07-05 DIAGNOSIS — Z01818 Encounter for other preprocedural examination: Secondary | ICD-10-CM | POA: Diagnosis present

## 2018-07-05 LAB — BASIC METABOLIC PANEL
Anion gap: 14 (ref 5–15)
BUN: 10 mg/dL (ref 6–20)
CO2: 26 mmol/L (ref 22–32)
Calcium: 9.4 mg/dL (ref 8.9–10.3)
Chloride: 93 mmol/L — ABNORMAL LOW (ref 98–111)
Creatinine, Ser: 0.94 mg/dL (ref 0.61–1.24)
GFR calc Af Amer: >60 mL/min (ref >60)
GFR calc non Af Amer: >60 mL/min (ref >60)
Glucose, Bld: 282 mg/dL — ABNORMAL HIGH (ref 70–99)
Potassium: 4.4 mmol/L (ref 3.5–5.1)
Sodium: 133 mmol/L — ABNORMAL LOW (ref 135–145)

## 2018-07-05 NOTE — Progress Notes (Addendum)
Ensure pre surgery drink given with instructions to complete by 0500 dos, pt verbalized understanding.  [place], notified Dr. Sabra Heck, will proceed with surgery as scheduled.

## 2018-07-09 ENCOUNTER — Other Ambulatory Visit (HOSPITAL_COMMUNITY)
Admission: RE | Admit: 2018-07-09 | Discharge: 2018-07-09 | Disposition: A | Discharge status: Home / Self Care | Payer: BC Managed Care – PPO | Source: Ambulatory Visit | Attending: Orthopedic Surgery | Admitting: Orthopedic Surgery

## 2018-07-09 DIAGNOSIS — Z1159 Encounter for screening for other viral diseases: Secondary | ICD-10-CM | POA: Diagnosis not present

## 2018-07-09 LAB — SARS CORONAVIRUS 2 BY RT PCR (HOSPITAL ORDER, PERFORMED IN ~~LOC~~ HOSPITAL LAB): SARS Coronavirus 2: NEGATIVE

## 2018-07-11 ENCOUNTER — Ambulatory Visit (HOSPITAL_BASED_OUTPATIENT_CLINIC_OR_DEPARTMENT_OTHER): Payer: BC Managed Care – PPO | Admitting: Anesthesiology

## 2018-07-11 ENCOUNTER — Other Ambulatory Visit: Payer: Self-pay

## 2018-07-11 ENCOUNTER — Encounter (HOSPITAL_BASED_OUTPATIENT_CLINIC_OR_DEPARTMENT_OTHER)
Admission: RE | Disposition: A | Discharge status: Home / Self Care | Payer: Self-pay | Source: Home / Self Care | Attending: Orthopedic Surgery

## 2018-07-11 ENCOUNTER — Ambulatory Visit (HOSPITAL_BASED_OUTPATIENT_CLINIC_OR_DEPARTMENT_OTHER)
Admission: RE | Admit: 2018-07-11 | Discharge: 2018-07-11 | Disposition: A | Discharge status: Home / Self Care | Payer: BC Managed Care – PPO | Attending: Orthopedic Surgery | Admitting: Orthopedic Surgery

## 2018-07-11 ENCOUNTER — Encounter (HOSPITAL_BASED_OUTPATIENT_CLINIC_OR_DEPARTMENT_OTHER): Payer: Self-pay

## 2018-07-11 DIAGNOSIS — K219 Gastro-esophageal reflux disease without esophagitis: Secondary | ICD-10-CM | POA: Insufficient documentation

## 2018-07-11 DIAGNOSIS — Z1159 Encounter for screening for other viral diseases: Secondary | ICD-10-CM | POA: Insufficient documentation

## 2018-07-11 DIAGNOSIS — Z79899 Other long term (current) drug therapy: Secondary | ICD-10-CM | POA: Insufficient documentation

## 2018-07-11 DIAGNOSIS — N189 Chronic kidney disease, unspecified: Secondary | ICD-10-CM | POA: Insufficient documentation

## 2018-07-11 DIAGNOSIS — M67441 Ganglion, right hand: Secondary | ICD-10-CM | POA: Insufficient documentation

## 2018-07-11 DIAGNOSIS — M19041 Primary osteoarthritis, right hand: Secondary | ICD-10-CM | POA: Diagnosis not present

## 2018-07-11 DIAGNOSIS — E7800 Pure hypercholesterolemia, unspecified: Secondary | ICD-10-CM | POA: Insufficient documentation

## 2018-07-11 DIAGNOSIS — I2510 Atherosclerotic heart disease of native coronary artery without angina pectoris: Secondary | ICD-10-CM | POA: Diagnosis not present

## 2018-07-11 HISTORY — PX: EXCISION METACARPAL MASS: SHX6372

## 2018-07-11 SURGERY — EXCISION METACARPAL MASS
Anesthesia: Regional | Site: Finger | Laterality: Right

## 2018-07-11 MED ORDER — ONDANSETRON HCL 4 MG/2ML IJ SOLN
INTRAMUSCULAR | Status: AC
  Filled 2018-07-11: qty 2

## 2018-07-11 MED ORDER — SUCCINYLCHOLINE CHLORIDE 200 MG/10ML IV SOSY
PREFILLED_SYRINGE | INTRAVENOUS | Status: AC
  Filled 2018-07-11: qty 10

## 2018-07-11 MED ORDER — PROPOFOL 10 MG/ML IV BOLUS
INTRAVENOUS | Status: DC | PRN
  Administered 2018-07-11 (×2): 20 mg via INTRAVENOUS

## 2018-07-11 MED ORDER — EPHEDRINE 5 MG/ML INJ
INTRAVENOUS | Status: AC
  Filled 2018-07-11: qty 10

## 2018-07-11 MED ORDER — MIDAZOLAM HCL 2 MG/2ML IJ SOLN
1.0000 mg | INTRAMUSCULAR | Status: DC | PRN
  Administered 2018-07-11: 09:00:00 2 mg via INTRAVENOUS

## 2018-07-11 MED ORDER — FENTANYL CITRATE (PF) 100 MCG/2ML IJ SOLN: 25.0000 ug | INTRAMUSCULAR | Status: DC | PRN

## 2018-07-11 MED ORDER — FENTANYL CITRATE (PF) 100 MCG/2ML IJ SOLN
INTRAMUSCULAR | Status: AC
  Filled 2018-07-11: qty 2

## 2018-07-11 MED ORDER — SCOPOLAMINE 1 MG/3DAYS TD PT72: 1.0000 | MEDICATED_PATCH | Freq: Once | TRANSDERMAL | Status: DC | PRN

## 2018-07-11 MED ORDER — MEPERIDINE HCL 25 MG/ML IJ SOLN: 6.2500 mg | INTRAMUSCULAR | Status: DC | PRN

## 2018-07-11 MED ORDER — LIDOCAINE HCL (PF) 0.5 % IJ SOLN
INTRAMUSCULAR | Status: DC | PRN
  Administered 2018-07-11: 30 mL via INTRAVENOUS

## 2018-07-11 MED ORDER — LACTATED RINGERS IV SOLN: INTRAVENOUS | Status: DC

## 2018-07-11 MED ORDER — TRAMADOL HCL 50 MG PO TABS: 50.0000 mg | ORAL_TABLET | Freq: Four times a day (QID) | ORAL | 0 refills | Status: DC | PRN

## 2018-07-11 MED ORDER — FENTANYL CITRATE (PF) 100 MCG/2ML IJ SOLN
50.0000 ug | INTRAMUSCULAR | Status: DC | PRN
  Administered 2018-07-11: 09:00:00 100 ug via INTRAVENOUS

## 2018-07-11 MED ORDER — LIDOCAINE 2% (20 MG/ML) 5 ML SYRINGE
INTRAMUSCULAR | Status: AC
  Filled 2018-07-11: qty 5

## 2018-07-11 MED ORDER — ONDANSETRON HCL 4 MG/2ML IJ SOLN
INTRAMUSCULAR | Status: DC | PRN
  Administered 2018-07-11: 4 mg via INTRAVENOUS

## 2018-07-11 MED ORDER — BUPIVACAINE HCL (PF) 0.25 % IJ SOLN
INTRAMUSCULAR | Status: DC | PRN
  Administered 2018-07-11: 9 mL

## 2018-07-11 MED ORDER — PROPOFOL 500 MG/50ML IV EMUL
INTRAVENOUS | Status: AC
  Filled 2018-07-11: qty 50

## 2018-07-11 MED ORDER — CHLORHEXIDINE GLUCONATE 4 % EX LIQD: 60.0000 mL | Freq: Once | CUTANEOUS | Status: DC

## 2018-07-11 MED ORDER — CEFAZOLIN SODIUM-DEXTROSE 2-4 GM/100ML-% IV SOLN
INTRAVENOUS | Status: AC
  Filled 2018-07-11: qty 100

## 2018-07-11 MED ORDER — LACTATED RINGERS IV SOLN
INTRAVENOUS | Status: DC
  Administered 2018-07-11: 08:00:00 via INTRAVENOUS

## 2018-07-11 MED ORDER — MIDAZOLAM HCL 2 MG/2ML IJ SOLN
INTRAMUSCULAR | Status: AC
  Filled 2018-07-11: qty 2

## 2018-07-11 MED ORDER — METOCLOPRAMIDE HCL 5 MG/ML IJ SOLN: 10.0000 mg | Freq: Once | INTRAMUSCULAR | Status: DC | PRN

## 2018-07-11 MED ORDER — CEFAZOLIN SODIUM-DEXTROSE 2-4 GM/100ML-% IV SOLN
2.0000 g | INTRAVENOUS | Status: AC
  Administered 2018-07-11 (×2): 2 g via INTRAVENOUS

## 2018-07-11 MED ORDER — PHENYLEPHRINE 40 MCG/ML (10ML) SYRINGE FOR IV PUSH (FOR BLOOD PRESSURE SUPPORT)
PREFILLED_SYRINGE | INTRAVENOUS | Status: AC
  Filled 2018-07-11: qty 10

## 2018-07-11 SURGICAL SUPPLY — 49 items
BLADE MINI RND TIP GREEN BEAV (BLADE) IMPLANT
BLADE SURG 15 STRL LF DISP TIS (BLADE) ×1 IMPLANT
BLADE SURG 15 STRL SS (BLADE) ×2
BNDG COHESIVE 1X5 TAN STRL LF (GAUZE/BANDAGES/DRESSINGS) IMPLANT
BNDG COHESIVE 2X5 TAN STRL LF (GAUZE/BANDAGES/DRESSINGS) ×3 IMPLANT
BNDG COHESIVE 3X5 TAN STRL LF (GAUZE/BANDAGES/DRESSINGS) IMPLANT
BNDG ESMARK 4X9 LF (GAUZE/BANDAGES/DRESSINGS) ×3 IMPLANT
BNDG GAUZE ELAST 4 BULKY (GAUZE/BANDAGES/DRESSINGS) IMPLANT
CHLORAPREP W/TINT 26 (MISCELLANEOUS) ×3 IMPLANT
CORD BIPOLAR FORCEPS 12FT (ELECTRODE) ×3 IMPLANT
COVER BACK TABLE REUSABLE LG (DRAPES) ×3 IMPLANT
COVER MAYO STAND REUSABLE (DRAPES) ×3 IMPLANT
COVER WAND RF STERILE (DRAPES) IMPLANT
CUFF TOURN SGL QUICK 18X4 (TOURNIQUET CUFF) IMPLANT
DECANTER SPIKE VIAL GLASS SM (MISCELLANEOUS) IMPLANT
DRAIN PENROSE 1/2X12 LTX STRL (WOUND CARE) IMPLANT
DRAPE EXTREMITY T 121X128X90 (DISPOSABLE) ×3 IMPLANT
DRAPE SURG 17X23 STRL (DRAPES) ×3 IMPLANT
GAUZE SPONGE 4X4 12PLY STRL (GAUZE/BANDAGES/DRESSINGS) ×3 IMPLANT
GAUZE XEROFORM 1X8 LF (GAUZE/BANDAGES/DRESSINGS) ×3 IMPLANT
GLOVE BIOGEL PI IND STRL 8 (GLOVE) ×1 IMPLANT
GLOVE BIOGEL PI IND STRL 8.5 (GLOVE) ×1 IMPLANT
GLOVE BIOGEL PI INDICATOR 8 (GLOVE) ×2
GLOVE BIOGEL PI INDICATOR 8.5 (GLOVE) ×2
GLOVE SURG ORTHO 8.0 STRL STRW (GLOVE) ×3 IMPLANT
GOWN STRL REUS W/ TWL LRG LVL3 (GOWN DISPOSABLE) ×1 IMPLANT
GOWN STRL REUS W/TWL LRG LVL3 (GOWN DISPOSABLE) ×2
GOWN STRL REUS W/TWL XL LVL3 (GOWN DISPOSABLE) ×3 IMPLANT
NDL SAFETY ECLIPSE 18X1.5 (NEEDLE) ×1 IMPLANT
NEEDLE HYPO 18GX1.5 SHARP (NEEDLE) ×2
NEEDLE PRECISIONGLIDE 27X1.5 (NEEDLE) IMPLANT
NS IRRIG 1000ML POUR BTL (IV SOLUTION) ×3 IMPLANT
PACK BASIN DAY SURGERY FS (CUSTOM PROCEDURE TRAY) ×3 IMPLANT
PAD CAST 3X4 CTTN HI CHSV (CAST SUPPLIES) IMPLANT
PADDING CAST ABS 3INX4YD NS (CAST SUPPLIES)
PADDING CAST ABS 4INX4YD NS (CAST SUPPLIES) ×2
PADDING CAST ABS COTTON 3X4 (CAST SUPPLIES) IMPLANT
PADDING CAST ABS COTTON 4X4 ST (CAST SUPPLIES) ×1 IMPLANT
PADDING CAST COTTON 3X4 STRL (CAST SUPPLIES)
SPLINT FINGER 3.25 BULB 911905 (SOFTGOODS) ×3 IMPLANT
SPLINT PLASTER CAST XFAST 3X15 (CAST SUPPLIES) IMPLANT
SPLINT PLASTER XTRA FASTSET 3X (CAST SUPPLIES)
STOCKINETTE 4X48 STRL (DRAPES) ×3 IMPLANT
SUT ETHILON 4 0 PS 2 18 (SUTURE) ×3 IMPLANT
SUT VIC AB 4-0 P2 18 (SUTURE) IMPLANT
SYR BULB 3OZ (MISCELLANEOUS) ×3 IMPLANT
SYR CONTROL 10ML LL (SYRINGE) IMPLANT
TOWEL GREEN STERILE FF (TOWEL DISPOSABLE) ×3 IMPLANT
UNDERPAD 30X30 (UNDERPADS AND DIAPERS) ×3 IMPLANT

## 2018-07-11 NOTE — Anesthesia Postprocedure Evaluation (Signed)
Anesthesia Post Note  Patient: Zachary Hamilton  Procedure(s) Performed: EXCISION CYST, DEBRIDEMENT DISTAL INTERPHALANGEAL JOINT RIGHT MIDDLE FINGER (Right Finger)     Patient location during evaluation: PACU Anesthesia Type: Bier Block Level of consciousness: awake and alert Pain management: pain level controlled Vital Signs Assessment: post-procedure vital signs reviewed and stable Respiratory status: spontaneous breathing, nonlabored ventilation, respiratory function stable and patient connected to nasal cannula oxygen Cardiovascular status: stable and blood pressure returned to baseline Postop Assessment: no apparent nausea or vomiting Anesthetic complications: no    Last Vitals:  Vitals:   07/11/18 0955 07/11/18 1003  BP:  119/68  Pulse: 61 61  Resp: 16 16  Temp:  37.1 C  SpO2: 97% 96%    Last Pain:  Vitals:   07/11/18 1003  TempSrc: Oral  PainSc: 0-No pain                 Montez Hageman

## 2018-07-11 NOTE — Brief Op Note (Signed)
07/11/2018  9:19 AM  PATIENT:  Marchelle Folks  58 y.o. male  PRE-OPERATIVE DIAGNOSIS:  MUCOID CYST, DEGENERATIVE JOINT DISEASE RIGHT MIDDLE FINGER  POST-OPERATIVE DIAGNOSIS:  MUCOID CYST, DEGENERATIVE JOINT DISEASE RIGHT MIDDLE  FINGER  PROCEDURE:  Procedure(s): EXCISION CYST, DEBRIDEMENT DISTAL INTERPHALANGEAL JOINT RIGHT MIDDLE FINGER (Right)  SURGEON:  Surgeon(s) and Role:    * Daryll Brod, MD - Primary  PHYSICIAN ASSISTANT:   ASSISTANTS: none   ANESTHESIA:   local, regional and IV sedation  EBL:  1 mL   BLOOD ADMINISTERED:none  DRAINS: none   LOCAL MEDICATIONS USED:  BUPIVICAINE   SPECIMEN:  Excision  DISPOSITION OF SPECIMEN:  PATHOLOGY  COUNTS:  YES  TOURNIQUET:   Total Tourniquet Time Documented: Forearm (Right) - 24 minutes Total: Forearm (Right) - 24 minutes   DICTATION: .Dragon Dictation  PLAN OF CARE: Discharge to home after PACU  PATIENT DISPOSITION:  PACU - hemodynamically stable.

## 2018-07-11 NOTE — Discharge Instructions (Addendum)

## 2018-07-11 NOTE — H&P (Signed)
Zachary Hamilton is an 58 y.o. male.   Chief Complaint:Mass right middle finger [id number] is a 58 year old right-hand-dominant male referred by Dr. Renda Rolls for consultation regarding a mass on the distal phalangeal joint ulnar aspect of his right middle finger. Has been present for approximately 2 months. Recalls no history of injury. Is not planning any pain or discomfort. He states he thought it was a initially a wart treated is as such and it did not change. He has had no other treatment nor tried anything for it. He has no history of diabetes thyroid problems arthritis or gout. History is negative for each of these also.    Past Medical History:  Diagnosis Date  . Chronic kidney disease   . Coronary artery disease    CTA 09/03/11 - 50-75% distal LAD, calcified prox LAD. NUC stress 09/05/11 - no flow limiting dz.   Marland Kitchen Dysphagia   . GERD (gastroesophageal reflux disease)   . Hypercholesteremia     Past Surgical History:  Procedure Laterality Date  . ESOPHAGOGASTRODUODENOSCOPY  02/10/2011   Procedure: ESOPHAGOGASTRODUODENOSCOPY (EGD);  Surgeon: Jeryl Columbia, MD;  Location: [facility];  Service: Endoscopy;  Laterality: N/A;  . LEFT HEART CATHETERIZATION WITH CORONARY ANGIOGRAM Bilateral 08/12/2012   Procedure: LEFT HEART CATHETERIZATION WITH CORONARY ANGIOGRAM;  Surgeon: Candee Furbish, MD;  Location: [facility] CATH LAB;  Service: Cardiovascular;  Laterality: Bilateral;  . NASAL SINUS SURGERY  2000  . SAVORY DILATION  02/10/2011   Procedure: SAVORY DILATION;  Surgeon: Jeryl Columbia, MD;  Location: [facility] ENDOSCOPY;  Service: Endoscopy;  Laterality: N/A;    Family History  Problem Relation Age of Onset  . Heart attack Father        x5, heavy smoker  . Diabetes Mother   . Breast cancer Sister   . Anesthesia problems Neg Hx    Social History:  reports that he has never smoked. He has never used smokeless tobacco. He reports current alcohol use of about 1.0 standard drinks of alcohol per week. He  reports that he does not use drugs.  Allergies: No Known Allergies  No medications prior to admission.    Results for orders placed or performed during the hospital encounter of 07/09/18 (from the past 48 hour(s))  SARS Coronavirus 2 (CEPHEID - Performed in [facility] hospital lab), Hosp Order     Status: None   Collection Time: 07/09/18 10:06 AM  Result Value Ref Range   SARS Coronavirus 2 NEGATIVE NEGATIVE    Comment: (NOTE) If result is NEGATIVE SARS-CoV-2 target nucleic acids are NOT DETECTED. The SARS-CoV-2 RNA is generally detectable in upper and lower  respiratory specimens during the acute phase of infection. The lowest  concentration of SARS-CoV-2 viral copies this assay can detect is 250  copies / mL. A negative result does not preclude SARS-CoV-2 infection  and should not be used as the sole basis for treatment or other  patient management decisions.  A negative result may occur with  improper specimen collection / handling, submission of specimen other  than nasopharyngeal swab, presence of viral mutation(s) within the  areas targeted by this assay, and inadequate number of viral copies  (<250 copies / mL). A negative result must be combined with clinical  observations, patient history, and epidemiological information. If result is POSITIVE SARS-CoV-2 target nucleic acids are DETECTED. The SARS-CoV-2 RNA is generally detectable in upper and lower  respiratory specimens dur ing the acute phase of infection.  Positive  results are indicative of  active infection with SARS-CoV-2.  Clinical  correlation with patient history and other diagnostic information is  necessary to determine patient infection status.  Positive results do  not rule out bacterial infection or co-infection with other viruses. If result is PRESUMPTIVE POSTIVE SARS-CoV-2 nucleic acids MAY BE PRESENT.   A presumptive positive result was obtained on the submitted specimen  and confirmed on repeat  testing.  While 2019 novel coronavirus  (SARS-CoV-2) nucleic acids may be present in the submitted sample  additional confirmatory testing may be necessary for epidemiological  and / or clinical management purposes  to differentiate between  SARS-CoV-2 and other Sarbecovirus currently known to infect humans.  If clinically indicated additional testing with an alternate test  methodology [phone]) is advised. The SARS-CoV-2 RNA is generally  detectable in upper and lower respiratory sp ecimens during the acute  phase of infection. The expected result is Negative. Fact Sheet for Patients:  StrictlyIdeas.no Fact Sheet for Healthcare Providers: BankingDealers.co.za This test is not yet approved or cleared by the [place] FDA and has been authorized for detection and/or diagnosis of SARS-CoV-2 by FDA under an Emergency Use Authorization (EUA).  This EUA will remain in effect (meaning this test can be used) for the duration of the COVID-19 declaration under Section 564(b)(1) of the Act, 21 U.S.C. section 360bbb-3(b)(1), unless the authorization is terminated or revoked sooner. Performed at [facility], [place] [address], [place], [place] [address]     No results found.   Pertinent items are noted in HPI.  Height 6' (1.829 m), weight 108.9 kg.  General appearance: alert, cooperative and appears stated age Head: Normocephalic, without obvious abnormality Neck: no JVD Resp: clear to auscultation bilaterally Cardio: regular rate and rhythm, S1, S2 normal, no murmur, click, rub or gallop GI: soft, non-tender; bowel sounds normal; no masses,  no organomegaly Extremities: Mass right middle finger Pulses: 2+ and symmetric Skin: Skin color, texture, turgor normal. No rashes or lesions Neurologic: Grossly normal Incision/Wound: na  Assessment/Plan Assessment:  1. Osteoarthritis of finger of right hand  2. Mucoid  cyst, joint    Plan: We have recommended surgical excision debridement of the joint. Pre-peri-postoperative course are discussed along with risks and complications. He is aware there is no guarantee to the surgery the possibility of infection recurrence injury to arteries nerves tendons complete relief symptoms dystrophy possibility of stiffness. He is scheduled for excision mucoid cyst debridement distal phalangeal joint right middle finger as an outpatient under regional anesthesia   Daryll Brod 07/11/2018, 4:55 AM

## 2018-07-11 NOTE — Op Note (Signed)
NAME: UNDRAY ALLMAN MEDICAL RECORD NO: [id number] DATE OF BIRTH: Feb 01, 1961 FAC[facility] LOCATION: [facility] PHYSICIAN: Wynonia Sours, MD   OPERATIVE REPORT   DATE OF PROCEDURE: 07/11/18    PREOPERATIVE DIAGNOSIS:   Mucoid cyst with degenerative arthritis right middle finger distal phalangeal joint   POSTOPERATIVE DIAGNOSIS:   Same   PROCEDURE:   Excision mucoid cyst with debridement distal phalangeal joint right middle finger   SURGEON: Daryll Brod, M.D.   ASSISTANT: none   ANESTHESIA:  Bier block with sedation and Local   INTRAVENOUS FLUIDS:  Per anesthesia flow sheet.   ESTIMATED BLOOD LOSS:  Minimal.   COMPLICATIONS:  None.   SPECIMENS:   Cyst   TOURNIQUET TIME:    Total Tourniquet Time Documented: Forearm (Right) - 24 minutes Total: Forearm (Right) - 24 minutes    DISPOSITION:  Stable to PACU.   INDICATIONS: Patient is a 58 year old male with a history of a mass in the distal phalangeal joint right middle finger.  This not responded to conservative treatment he is like to undergo excision of the cyst debridement of the distal phalangeal joint.  Pre-peri-and postoperative course been discussed along with risks and complications.  He is aware that there is no guarantee to the surgery the possibility of infection recurrence injury to arteries nerves tendons complete relief symptoms and dystrophy.  In the preoperative area the patient seen extremity marked by both patient and surgeon antibiotic given  OPERATIVE COURSE: Patient is brought to the operating room where form based IV regional anesthetic was carried out without difficulty under the direction of the anesthesia department.  Was prepped using ChloraPrep and in supine position with the right arm free.  A three-minute dry time was allowed and a timeout taken to confirm patient procedure.  A metacarpal block was given quarter percent bupivacaine without epinephrine 9 cc was used.  Curvilinear  incision was made over the cyst distal phalangeal joint carried down through subcutaneous tissue.  The cyst was identified bluntly dissected free and followed down into the joint cyst was sent to pathology a synovectomy and removal of osteophytes using hemostatic rondure was performed.  Wound was copiously irrigated with saline.  The skin was closed erupted 4 nylon sutures.  Sterile compressive dressing and splint to the finger was applied.  Inflation the tourniquet remaining fingers pink.  He was taken to the recovery room for observation in satisfactory condition.  He will be discharged home to return hand center [facility] in 1week on ibuprofen with Ultram as a backup.   Daryll Brod, MD Electronically signed, 07/11/18

## 2018-07-11 NOTE — Transfer of Care (Signed)
Immediate Anesthesia Transfer of Care Note  Patient: Zachary Hamilton  Procedure(s) Performed: EXCISION CYST, DEBRIDEMENT DISTAL INTERPHALANGEAL JOINT RIGHT MIDDLE FINGER (Right Finger)  Patient Location: PACU  Anesthesia Type:MAC and Bier block  Level of Consciousness: awake, alert  and oriented  Airway & Oxygen Therapy: Patient Spontanous Breathing and Patient connected to face mask oxygen  Post-op Assessment: Report given to RN and Post -op Vital signs reviewed and stable  Post vital signs: Reviewed and stable  Last Vitals:  Vitals Value Taken Time  BP    Temp    Pulse 68 07/11/2018  9:17 AM  Resp 25 07/11/2018  9:17 AM  SpO2 100 % 07/11/2018  9:17 AM  Vitals shown include unvalidated device data.  Last Pain:  Vitals:   07/11/18 0727  TempSrc: Oral  PainSc: 0-No pain         Complications: No apparent anesthesia complications

## 2018-07-11 NOTE — Anesthesia Preprocedure Evaluation (Signed)
Anesthesia Evaluation  Patient identified by MRN, date of birth, ID band Patient awake    Reviewed: Allergy & Precautions, NPO status , Patient's Chart, lab work & pertinent test results  Airway Mallampati: II  TM Distance: >3 FB Neck ROM: Full    Dental no notable dental hx.    Pulmonary neg pulmonary ROS,    Pulmonary exam normal breath sounds clear to auscultation       Cardiovascular + CAD  Normal cardiovascular exam Rhythm:Regular Rate:Normal     Neuro/Psych negative neurological ROS  negative psych ROS   GI/Hepatic Neg liver ROS, GERD  Medicated and Controlled,  Endo/Other  negative endocrine ROS  Renal/GU negative Renal ROS  negative genitourinary   Musculoskeletal negative musculoskeletal ROS (+)   Abdominal   Peds negative pediatric ROS (+)  Hematology negative hematology ROS (+)   Anesthesia Other Findings   Reproductive/Obstetrics negative OB ROS                             Anesthesia Physical Anesthesia Plan  ASA: II  Anesthesia Plan: Bier Block and Bier Block-LIDOCAINE ONLY   Post-op Pain Management:    Induction:   PONV Risk Score and Plan: 1 and Treatment may vary due to age or medical condition  Airway Management Planned: Simple Face Mask and Nasal Cannula  Additional Equipment:   Intra-op Plan:   Post-operative Plan:   Informed Consent: I have reviewed the patients History and Physical, chart, labs and discussed the procedure including the risks, benefits and alternatives for the proposed anesthesia with the patient or authorized representative who has indicated his/her understanding and acceptance.     Dental advisory given  Plan Discussed with:   Anesthesia Plan Comments:         Anesthesia Quick Evaluation

## 2018-07-12 ENCOUNTER — Encounter (HOSPITAL_BASED_OUTPATIENT_CLINIC_OR_DEPARTMENT_OTHER): Payer: Self-pay | Admitting: Orthopedic Surgery

## 2018-07-29 ENCOUNTER — Other Ambulatory Visit: Payer: Self-pay | Admitting: Cardiology

## 2018-10-17 ENCOUNTER — Other Ambulatory Visit: Payer: Self-pay

## 2018-10-17 DIAGNOSIS — Z20822 Contact with and (suspected) exposure to covid-19: Secondary | ICD-10-CM

## 2018-10-18 LAB — NOVEL CORONAVIRUS, NAA: SARS-CoV-2, NAA: NOT DETECTED

## 2019-01-16 ENCOUNTER — Other Ambulatory Visit: Payer: Self-pay | Admitting: Physician Assistant

## 2019-01-16 DIAGNOSIS — R1012 Left upper quadrant pain: Secondary | ICD-10-CM

## 2019-01-24 ENCOUNTER — Ambulatory Visit
Admission: RE | Admit: 2019-01-24 | Discharge: 2019-01-24 | Disposition: A | Discharge status: Home / Self Care | Payer: BC Managed Care – PPO | Source: Ambulatory Visit | Attending: Physician Assistant | Admitting: Physician Assistant

## 2019-01-24 DIAGNOSIS — R1012 Left upper quadrant pain: Secondary | ICD-10-CM

## 2019-01-27 ENCOUNTER — Other Ambulatory Visit: Payer: Self-pay | Admitting: Physician Assistant

## 2019-01-27 DIAGNOSIS — R160 Hepatomegaly, not elsewhere classified: Secondary | ICD-10-CM

## 2019-01-28 ENCOUNTER — Ambulatory Visit
Admission: RE | Admit: 2019-01-28 | Discharge: 2019-01-28 | Disposition: A | Discharge status: Home / Self Care | Payer: BC Managed Care – PPO | Source: Ambulatory Visit | Attending: Physician Assistant | Admitting: Physician Assistant

## 2019-01-28 ENCOUNTER — Other Ambulatory Visit: Payer: Self-pay

## 2019-01-28 DIAGNOSIS — R160 Hepatomegaly, not elsewhere classified: Secondary | ICD-10-CM

## 2019-01-28 MED ORDER — GADOBENATE DIMEGLUMINE 529 MG/ML IV SOLN
20.0000 mL | Freq: Once | INTRAVENOUS | Status: AC | PRN
  Administered 2019-01-28: 20 mL via INTRAVENOUS

## 2019-04-12 ENCOUNTER — Ambulatory Visit: Payer: BC Managed Care – PPO | Attending: Internal Medicine

## 2019-04-12 DIAGNOSIS — Z23 Encounter for immunization: Secondary | ICD-10-CM | POA: Insufficient documentation

## 2019-04-12 NOTE — Progress Notes (Signed)
   Covid-19 Vaccination Clinic  Name:  Zachary Hamilton    MRN: [id number] DOB: 1960/04/21  04/12/2019  Zachary Hamilton was observed post Covid-19 immunization for 15 minutes without incidence. He was provided with Vaccine Information Sheet and instruction to access the V-Safe system.   Zachary Hamilton was instructed to call 911 with any severe reactions post vaccine: Marland Kitchen Difficulty breathing  . Swelling of your face and throat  . A fast heartbeat  . A bad rash all over your body  . Dizziness and weakness    Immunizations Administered    Name Date Dose VIS Date Route   Pfizer COVID-19 Vaccine 04/12/2019 12:12 PM 0.3 mL 01/24/2019 Intramuscular   Manufacturer: [place]   Lot: [id number]   [place]: [id number]

## 2019-05-03 ENCOUNTER — Ambulatory Visit: Payer: BC Managed Care – PPO | Attending: Internal Medicine

## 2019-05-03 DIAGNOSIS — Z23 Encounter for immunization: Secondary | ICD-10-CM

## 2019-05-03 NOTE — Progress Notes (Signed)
   Covid-19 Vaccination Clinic  Name:  Zachary Hamilton    MRN: [id number] DOB: 1960/05/19  05/03/2019  [name] was observed post Covid-19 immunization for 15 minutes without incident. He was provided with Vaccine Information Sheet and instruction to access the V-Safe system.   [name] was instructed to call 911 with any severe reactions post vaccine: Marland Kitchen Difficulty breathing  . Swelling of face and throat  . A fast heartbeat  . A bad rash all over body  . Dizziness and weakness   Immunizations Administered    Name Date Dose VIS Date Route   Pfizer COVID-19 Vaccine 05/03/2019  2:11 PM 0.3 mL 01/24/2019 Intramuscular   Manufacturer: [place]   Lot: [id number]   [place]: [id number]

## 2019-05-07 ENCOUNTER — Ambulatory Visit: Payer: BC Managed Care – PPO

## 2019-06-29 ENCOUNTER — Other Ambulatory Visit: Payer: Self-pay | Admitting: Cardiology

## 2019-06-30 ENCOUNTER — Other Ambulatory Visit: Payer: Self-pay

## 2019-06-30 ENCOUNTER — Encounter (HOSPITAL_COMMUNITY): Payer: Self-pay

## 2019-06-30 ENCOUNTER — Encounter (HOSPITAL_COMMUNITY)
Admission: EM | Disposition: A | Discharge status: Home / Self Care | Payer: Self-pay | Source: Home / Self Care | Attending: Cardiology

## 2019-06-30 ENCOUNTER — Inpatient Hospital Stay (HOSPITAL_COMMUNITY)
Admission: EM | Admit: 2019-06-30 | Discharge: 2019-07-01 | DRG: 247 | Disposition: A | Discharge status: Home / Self Care | Payer: BC Managed Care – PPO | Attending: Cardiology | Admitting: Cardiology

## 2019-06-30 ENCOUNTER — Emergency Department (HOSPITAL_COMMUNITY): Payer: BC Managed Care – PPO

## 2019-06-30 DIAGNOSIS — Z6831 Body mass index (BMI) 31.0-31.9, adult: Secondary | ICD-10-CM

## 2019-06-30 DIAGNOSIS — I2582 Chronic total occlusion of coronary artery: Secondary | ICD-10-CM | POA: Diagnosis present

## 2019-06-30 DIAGNOSIS — Z8249 Family history of ischemic heart disease and other diseases of the circulatory system: Secondary | ICD-10-CM

## 2019-06-30 DIAGNOSIS — I200 Unstable angina: Secondary | ICD-10-CM | POA: Diagnosis present

## 2019-06-30 DIAGNOSIS — Z20822 Contact with and (suspected) exposure to covid-19: Secondary | ICD-10-CM | POA: Diagnosis present

## 2019-06-30 DIAGNOSIS — E1165 Type 2 diabetes mellitus with hyperglycemia: Secondary | ICD-10-CM | POA: Diagnosis present

## 2019-06-30 DIAGNOSIS — E669 Obesity, unspecified: Secondary | ICD-10-CM | POA: Diagnosis present

## 2019-06-30 DIAGNOSIS — I2510 Atherosclerotic heart disease of native coronary artery without angina pectoris: Secondary | ICD-10-CM | POA: Diagnosis present

## 2019-06-30 DIAGNOSIS — I25110 Atherosclerotic heart disease of native coronary artery with unstable angina pectoris: Secondary | ICD-10-CM

## 2019-06-30 DIAGNOSIS — E782 Mixed hyperlipidemia: Secondary | ICD-10-CM

## 2019-06-30 DIAGNOSIS — R079 Chest pain, unspecified: Secondary | ICD-10-CM | POA: Diagnosis not present

## 2019-06-30 DIAGNOSIS — N189 Chronic kidney disease, unspecified: Secondary | ICD-10-CM | POA: Diagnosis present

## 2019-06-30 DIAGNOSIS — I214 Non-ST elevation (NSTEMI) myocardial infarction: Secondary | ICD-10-CM | POA: Diagnosis present

## 2019-06-30 DIAGNOSIS — E119 Type 2 diabetes mellitus without complications: Secondary | ICD-10-CM

## 2019-06-30 DIAGNOSIS — I129 Hypertensive chronic kidney disease with stage 1 through stage 4 chronic kidney disease, or unspecified chronic kidney disease: Secondary | ICD-10-CM | POA: Diagnosis present

## 2019-06-30 DIAGNOSIS — E1122 Type 2 diabetes mellitus with diabetic chronic kidney disease: Secondary | ICD-10-CM | POA: Diagnosis present

## 2019-06-30 DIAGNOSIS — Z833 Family history of diabetes mellitus: Secondary | ICD-10-CM | POA: Diagnosis not present

## 2019-06-30 DIAGNOSIS — E785 Hyperlipidemia, unspecified: Secondary | ICD-10-CM | POA: Diagnosis present

## 2019-06-30 DIAGNOSIS — I10 Essential (primary) hypertension: Secondary | ICD-10-CM

## 2019-06-30 DIAGNOSIS — K219 Gastro-esophageal reflux disease without esophagitis: Secondary | ICD-10-CM | POA: Diagnosis present

## 2019-06-30 DIAGNOSIS — Z955 Presence of coronary angioplasty implant and graft: Secondary | ICD-10-CM

## 2019-06-30 DIAGNOSIS — Z79899 Other long term (current) drug therapy: Secondary | ICD-10-CM

## 2019-06-30 DIAGNOSIS — I479 Paroxysmal tachycardia, unspecified: Secondary | ICD-10-CM | POA: Diagnosis not present

## 2019-06-30 DIAGNOSIS — Z7982 Long term (current) use of aspirin: Secondary | ICD-10-CM | POA: Diagnosis not present

## 2019-06-30 HISTORY — DX: Type 2 diabetes mellitus without complications: E11.9

## 2019-06-30 HISTORY — PX: CORONARY STENT INTERVENTION: CATH118234

## 2019-06-30 HISTORY — PX: CARDIAC CATHETERIZATION: SHX172

## 2019-06-30 HISTORY — PX: LEFT HEART CATH AND CORONARY ANGIOGRAPHY: CATH118249

## 2019-06-30 LAB — TROPONIN I (HIGH SENSITIVITY)
Troponin I (High Sensitivity): 135 ng/L (ref <18)
Troponin I (High Sensitivity): 258 ng/L (ref <18)
Troponin I (High Sensitivity): 1048 ng/L (ref <18)
Troponin I (High Sensitivity): 2236 ng/L (ref <18)

## 2019-06-30 LAB — BASIC METABOLIC PANEL
Anion gap: 12 (ref 5–15)
BUN: 15 mg/dL (ref 6–20)
CO2: 27 mmol/L (ref 22–32)
Calcium: 9.5 mg/dL (ref 8.9–10.3)
Chloride: 95 mmol/L — ABNORMAL LOW (ref 98–111)
Creatinine, Ser: 0.88 mg/dL (ref 0.61–1.24)
GFR calc Af Amer: >60 mL/min (ref >60)
GFR calc non Af Amer: >60 mL/min (ref >60)
Glucose, Bld: 281 mg/dL — ABNORMAL HIGH (ref 70–99)
Potassium: 3.6 mmol/L (ref 3.5–5.1)
Sodium: 134 mmol/L — ABNORMAL LOW (ref 135–145)

## 2019-06-30 LAB — POCT ACTIVATED CLOTTING TIME
Activated Clotting Time: 219 seconds
Activated Clotting Time: 301 seconds

## 2019-06-30 LAB — CBC
HCT: 45 % (ref 39.0–52.0)
Hemoglobin: 15.1 g/dL (ref 13.0–17.0)
MCH: 30 pg (ref 26.0–34.0)
MCHC: 33.6 g/dL (ref 30.0–36.0)
MCV: 89.3 fL (ref 80.0–100.0)
Platelets: 261 10*3/uL (ref 150–400)
RBC: 5.04 MIL/uL (ref 4.22–5.81)
RDW: 12.2 % (ref 11.5–15.5)
WBC: 6.7 10*3/uL (ref 4.0–10.5)
nRBC: 0 % (ref 0.0–0.2)

## 2019-06-30 LAB — SARS CORONAVIRUS 2 BY RT PCR (HOSPITAL ORDER, PERFORMED IN ~~LOC~~ HOSPITAL LAB): SARS Coronavirus 2: NEGATIVE

## 2019-06-30 LAB — LIPID PANEL
Cholesterol: 199 mg/dL (ref 0–200)
HDL: 46 mg/dL (ref >40)
LDL Cholesterol: 136 mg/dL — ABNORMAL HIGH (ref 0–99)
Total CHOL/HDL Ratio: 4.3 RATIO
Triglycerides: 83 mg/dL (ref <150)
VLDL: 17 mg/dL (ref 0–40)

## 2019-06-30 SURGERY — LEFT HEART CATH AND CORONARY ANGIOGRAPHY
Anesthesia: LOCAL

## 2019-06-30 MED ORDER — NITROGLYCERIN 1 MG/10 ML FOR IR/CATH LAB
INTRA_ARTERIAL | Status: DC | PRN
  Administered 2019-06-30: 200 ug via INTRACORONARY

## 2019-06-30 MED ORDER — SODIUM CHLORIDE 0.9 % WEIGHT BASED INFUSION: 3.0000 mL/kg/h | INTRAVENOUS | Status: DC

## 2019-06-30 MED ORDER — NITROGLYCERIN 1 MG/10 ML FOR IR/CATH LAB
INTRA_ARTERIAL | Status: AC
  Filled 2019-06-30: qty 10

## 2019-06-30 MED ORDER — FENTANYL CITRATE (PF) 100 MCG/2ML IJ SOLN
INTRAMUSCULAR | Status: DC | PRN
  Administered 2019-06-30 (×2): 25 ug via INTRAVENOUS

## 2019-06-30 MED ORDER — SODIUM CHLORIDE 0.9% FLUSH: 3.0000 mL | Freq: Two times a day (BID) | INTRAVENOUS | Status: DC

## 2019-06-30 MED ORDER — HEPARIN (PORCINE) 25000 UT/250ML-% IV SOLN
1200.0000 [IU]/h | INTRAVENOUS | Status: DC
  Administered 2019-06-30: 1200 [IU]/h via INTRAVENOUS
  Filled 2019-06-30: qty 250

## 2019-06-30 MED ORDER — HEPARIN (PORCINE) IN NACL 1000-0.9 UT/500ML-% IV SOLN
INTRAVENOUS | Status: DC | PRN
  Administered 2019-06-30 (×2): 500 mL

## 2019-06-30 MED ORDER — ACETAMINOPHEN 325 MG PO TABS: 650.0000 mg | ORAL_TABLET | ORAL | Status: DC | PRN

## 2019-06-30 MED ORDER — LOSARTAN POTASSIUM-HCTZ 100-25 MG PO TABS: 1.0000 | ORAL_TABLET | Freq: Every day | ORAL | Status: DC

## 2019-06-30 MED ORDER — IOHEXOL 350 MG/ML SOLN
INTRAVENOUS | Status: DC | PRN
  Administered 2019-06-30: 150 mL via INTRA_ARTERIAL

## 2019-06-30 MED ORDER — ONDANSETRON HCL 4 MG/2ML IJ SOLN: 4.0000 mg | Freq: Four times a day (QID) | INTRAMUSCULAR | Status: DC | PRN

## 2019-06-30 MED ORDER — INSULIN ASPART 100 UNIT/ML ~~LOC~~ SOLN: 0.0000 [IU] | Freq: Three times a day (TID) | SUBCUTANEOUS | Status: DC

## 2019-06-30 MED ORDER — HYDROCHLOROTHIAZIDE 25 MG PO TABS
25.0000 mg | ORAL_TABLET | Freq: Every day | ORAL | Status: DC
  Administered 2019-07-01: 25 mg via ORAL
  Filled 2019-06-30: qty 1

## 2019-06-30 MED ORDER — HEPARIN SODIUM (PORCINE) 1000 UNIT/ML IJ SOLN
INTRAMUSCULAR | Status: AC
  Filled 2019-06-30: qty 1

## 2019-06-30 MED ORDER — LIDOCAINE HCL (PF) 1 % IJ SOLN
INTRAMUSCULAR | Status: DC | PRN
  Administered 2019-06-30: 2 mL

## 2019-06-30 MED ORDER — HEPARIN SODIUM (PORCINE) 5000 UNIT/ML IJ SOLN
5000.0000 [IU] | Freq: Three times a day (TID) | INTRAMUSCULAR | Status: DC
  Administered 2019-07-01: 5000 [IU] via SUBCUTANEOUS
  Filled 2019-06-30: qty 1

## 2019-06-30 MED ORDER — HYDRALAZINE HCL 20 MG/ML IJ SOLN: 10.0000 mg | INTRAMUSCULAR | Status: DC | PRN

## 2019-06-30 MED ORDER — SODIUM CHLORIDE 0.9 % IV SOLN: INTRAVENOUS | Status: AC

## 2019-06-30 MED ORDER — FENTANYL CITRATE (PF) 100 MCG/2ML IJ SOLN
INTRAMUSCULAR | Status: AC
  Filled 2019-06-30: qty 2

## 2019-06-30 MED ORDER — VERAPAMIL HCL 2.5 MG/ML IV SOLN
INTRAVENOUS | Status: DC | PRN
  Administered 2019-06-30: 10 mL via INTRA_ARTERIAL

## 2019-06-30 MED ORDER — ASPIRIN EC 81 MG PO TBEC: 81.0000 mg | DELAYED_RELEASE_TABLET | Freq: Every day | ORAL | Status: DC

## 2019-06-30 MED ORDER — TICAGRELOR 90 MG PO TABS
ORAL_TABLET | ORAL | Status: DC | PRN
  Administered 2019-06-30: 180 mg via ORAL

## 2019-06-30 MED ORDER — MIDAZOLAM HCL 2 MG/2ML IJ SOLN
INTRAMUSCULAR | Status: AC
  Filled 2019-06-30: qty 2

## 2019-06-30 MED ORDER — ISOSORBIDE MONONITRATE ER 30 MG PO TB24
30.0000 mg | ORAL_TABLET | Freq: Every day | ORAL | Status: DC
  Administered 2019-07-01: 30 mg via ORAL
  Filled 2019-06-30: qty 1

## 2019-06-30 MED ORDER — MIDAZOLAM HCL 2 MG/2ML IJ SOLN
INTRAMUSCULAR | Status: DC | PRN
  Administered 2019-06-30 (×3): 1 mg via INTRAVENOUS

## 2019-06-30 MED ORDER — HEPARIN SODIUM (PORCINE) 1000 UNIT/ML IJ SOLN
INTRAMUSCULAR | Status: DC | PRN
  Administered 2019-06-30 (×2): 5000 [IU] via INTRAVENOUS
  Administered 2019-06-30: 4000 [IU] via INTRAVENOUS

## 2019-06-30 MED ORDER — TICAGRELOR 90 MG PO TABS
ORAL_TABLET | ORAL | Status: AC
  Filled 2019-06-30: qty 1

## 2019-06-30 MED ORDER — SODIUM CHLORIDE 0.9 % WEIGHT BASED INFUSION: 1.0000 mL/kg/h | INTRAVENOUS | Status: DC

## 2019-06-30 MED ORDER — HEPARIN BOLUS VIA INFUSION
4000.0000 [IU] | Freq: Once | INTRAVENOUS | Status: AC
  Administered 2019-06-30: 4000 [IU] via INTRAVENOUS
  Filled 2019-06-30: qty 4000

## 2019-06-30 MED ORDER — TICAGRELOR 90 MG PO TABS
90.0000 mg | ORAL_TABLET | Freq: Two times a day (BID) | ORAL | Status: DC
  Administered 2019-06-30 – 2019-07-01 (×2): 90 mg via ORAL
  Filled 2019-06-30 (×2): qty 1

## 2019-06-30 MED ORDER — SODIUM CHLORIDE 0.9 % IV SOLN: 250.0000 mL | INTRAVENOUS | Status: DC | PRN

## 2019-06-30 MED ORDER — PANTOPRAZOLE SODIUM 40 MG PO TBEC
40.0000 mg | DELAYED_RELEASE_TABLET | Freq: Every day | ORAL | Status: DC
  Administered 2019-07-01: 40 mg via ORAL
  Filled 2019-06-30: qty 1

## 2019-06-30 MED ORDER — LABETALOL HCL 5 MG/ML IV SOLN: 10.0000 mg | INTRAVENOUS | Status: DC | PRN

## 2019-06-30 MED ORDER — VERAPAMIL HCL 2.5 MG/ML IV SOLN
INTRAVENOUS | Status: AC
  Filled 2019-06-30: qty 2

## 2019-06-30 MED ORDER — OXYCODONE HCL 5 MG PO TABS: 5.0000 mg | ORAL_TABLET | ORAL | Status: DC | PRN

## 2019-06-30 MED ORDER — SODIUM CHLORIDE 0.9% FLUSH: 3.0000 mL | INTRAVENOUS | Status: DC | PRN

## 2019-06-30 MED ORDER — ASPIRIN 81 MG PO CHEW
81.0000 mg | CHEWABLE_TABLET | Freq: Every day | ORAL | Status: DC
  Administered 2019-07-01: 81 mg via ORAL
  Filled 2019-06-30: qty 1

## 2019-06-30 MED ORDER — ATORVASTATIN CALCIUM 80 MG PO TABS
80.0000 mg | ORAL_TABLET | Freq: Every day | ORAL | Status: DC
  Administered 2019-06-30 – 2019-07-01 (×2): 80 mg via ORAL
  Filled 2019-06-30 (×3): qty 1

## 2019-06-30 MED ORDER — SODIUM CHLORIDE 0.9 % IV SOLN
INTRAVENOUS | Status: AC | PRN
  Administered 2019-06-30: 100 mL/h via INTRAVENOUS

## 2019-06-30 MED ORDER — LOSARTAN POTASSIUM 50 MG PO TABS
100.0000 mg | ORAL_TABLET | Freq: Every day | ORAL | Status: DC
  Administered 2019-07-01: 100 mg via ORAL
  Filled 2019-06-30: qty 2

## 2019-06-30 MED ORDER — ASPIRIN 81 MG PO CHEW
81.0000 mg | CHEWABLE_TABLET | ORAL | Status: AC
  Administered 2019-06-30: 81 mg via ORAL
  Filled 2019-06-30: qty 1

## 2019-06-30 MED ORDER — NITROGLYCERIN 0.4 MG SL SUBL
0.4000 mg | SUBLINGUAL_TABLET | SUBLINGUAL | Status: DC | PRN
  Administered 2019-06-30 (×2): 0.4 mg via SUBLINGUAL
  Filled 2019-06-30: qty 1

## 2019-06-30 SURGICAL SUPPLY — 17 items
BALLN SAPPHIRE 2.5X12 (BALLOONS) ×2
BALLN SAPPHIRE ~~LOC~~ 2.75X12 (BALLOONS) ×2 IMPLANT
BALLOON SAPPHIRE 2.5X12 (BALLOONS) ×1 IMPLANT
CATH 5FR JL3.5 JR4 ANG PIG MP (CATHETERS) ×2 IMPLANT
CATH VISTA GUIDE 6FR XBLAD3.5 (CATHETERS) ×2 IMPLANT
DEVICE RAD COMP TR BAND LRG (VASCULAR PRODUCTS) ×2 IMPLANT
GLIDESHEATH SLEND A-KIT 6F 22G (SHEATH) ×2 IMPLANT
GUIDEWIRE INQWIRE 1.5J.035X260 (WIRE) ×1 IMPLANT
INQWIRE 1.5J .035X260CM (WIRE) ×2
KIT ENCORE 26 ADVANTAGE (KITS) ×2 IMPLANT
KIT HEART LEFT (KITS) ×2 IMPLANT
PACK CARDIAC CATHETERIZATION (CUSTOM PROCEDURE TRAY) ×2 IMPLANT
SHEATH PROBE COVER 6X72 (BAG) ×2 IMPLANT
STENT RESOLUTE ONYX 2.5X26 (Permanent Stent) ×2 IMPLANT
TRANSDUCER W/STOPCOCK (MISCELLANEOUS) ×2 IMPLANT
TUBING CIL FLEX 10 FLL-RA (TUBING) ×2 IMPLANT
WIRE ASAHI PROWATER 180CM (WIRE) ×2 IMPLANT

## 2019-06-30 NOTE — CV Procedure (Signed)
   Coronary angiography, left ventriculography, percutaneous coronary angioplasty of mid LAD with stent implantation.  Radial access using real-time vascular ultrasound.  Left main is widely patent  Mid LAD contains segmental 80% stenosis within a diffusely diseased segment.  Apical LAD is totally occluded without distal collaterals of significance.  First diagonal of the LAD contains ostial 60 to 70%  Ramus intermedius contains 60% proximal narrowing  Circumflex contains irregularities but no high-grade obstruction  Right coronary is totally occluded and fills via left septal perforator collaterals to distal RCA.  Overall LV function severe inferobasal to mid inferior wall hypokinesis.  EF 40 to 50%.

## 2019-06-30 NOTE — ED Provider Notes (Signed)
[facility] EMERGENCY DEPARTMENT Provider Note   CSN: [id number] Arrival date & time: 06/30/19  0719     History Chief Complaint  Patient presents with  . Chest Pain  . Shortness of Breath    Zachary Hamilton is a 59 y.o. male.  He has a history of CKD, coronary disease, GERD.  Complaining of substernal chest pain that started 2 days ago.  Radiates into his back into his shoulders down his right arm.  Associated with some tingling in the right arm and some feels weak sometimes.  Feels somewhat like his angina although also thought it might be his reflux.  Took nitro with some improvement 2 days ago.  Tried his reflux medicine without any improvement.  Associated with some shortness of breath.  No diaphoresis nausea dizziness.  History of an abnormal cath years ago with single-vessel disease at 50%.  Taking his regular medications.  The history is provided by the patient.    HPI: A 59 year old patient with a history of hypertension, hypercholesterolemia and obesity presents for evaluation of chest pain. Initial onset of pain was more than 6 hours ago. The patient's chest pain is not worse with exertion. The patient's chest pain is middle- or left-sided, is not well-localized, is not described as heaviness/pressure/tightness, is not sharp and does radiate to the arms/jaw/neck. The patient does not complain of nausea and denies diaphoresis. The patient has no history of stroke, has no history of peripheral artery disease, has not smoked in the past 90 days, denies any history of treated diabetes and has no relevant family history of coronary artery disease (first degree relative at less than age 36).   Past Medical History:  Diagnosis Date  . Chronic kidney disease   . Coronary artery disease    CTA 09/03/11 - 50-75% distal LAD, calcified prox LAD. NUC stress 09/05/11 - no flow limiting dz.   Marland Kitchen Dysphagia   . GERD (gastroesophageal reflux disease)   . Hypercholesteremia      Patient Active Problem List   Diagnosis Date Noted  . CAD (coronary artery disease) 08/11/2012  . Hyperlipidemia 09/01/2011  . GERD (gastroesophageal reflux disease) 09/01/2011  . Chest pain 09/01/2011  . Family history of ischemic heart disease 09/01/2011    Past Surgical History:  Procedure Laterality Date  . ESOPHAGOGASTRODUODENOSCOPY  02/10/2011   Procedure: ESOPHAGOGASTRODUODENOSCOPY (EGD);  Surgeon: Jeryl Columbia, MD;  Location: [facility];  Service: Endoscopy;  Laterality: N/A;  . EXCISION METACARPAL MASS Right 07/11/2018   Procedure: EXCISION CYST, DEBRIDEMENT DISTAL INTERPHALANGEAL JOINT RIGHT MIDDLE FINGER;  Surgeon: Daryll Brod, MD;  Location: [facility];  Service: Orthopedics;  Laterality: Right;  . LEFT HEART CATHETERIZATION WITH CORONARY ANGIOGRAM Bilateral 08/12/2012   Procedure: LEFT HEART CATHETERIZATION WITH CORONARY ANGIOGRAM;  Surgeon: Candee Furbish, MD;  Location: [facility] CATH LAB;  Service: Cardiovascular;  Laterality: Bilateral;  . NASAL SINUS SURGERY  2000  . SAVORY DILATION  02/10/2011   Procedure: SAVORY DILATION;  Surgeon: Jeryl Columbia, MD;  Location: [facility];  Service: Endoscopy;  Laterality: N/A;       Family History  Problem Relation Age of Onset  . Heart attack Father        x5, heavy smoker  . Diabetes Mother   . Breast cancer Sister   . Anesthesia problems Neg Hx     Social History   Tobacco Use  . Smoking status: Never Smoker  . Smokeless tobacco: Never Used  . Tobacco comment:  grew up with a smoker  Substance Use Topics  . Alcohol use: Yes    Alcohol/week: 1.0 standard drinks    Types: 1 Cans of beer per week  . Drug use: No    Home Medications Prior to Admission medications   Medication Sig Start Date End Date Taking? Authorizing Provider  Coenzyme Q10 300 MG CAPS Take 1 capsule by mouth daily.    [provider]  isosorbide mononitrate (IMDUR) 30 MG 24 hr tablet TAKE 1 TABLET BY MOUTH EVERY DAY Patient  taking differently: Take 30 mg by mouth daily.  06/26/18   Jerline Pain, MD  losartan-hydrochlorothiazide (HYZAAR) 100-25 MG tablet TAKE 1 TABLET BY MOUTH EVERY DAY Patient taking differently: Take 1 tablet by mouth daily.  07/29/18   Jerline Pain, MD  nitroGLYCERIN (NITROSTAT) 0.4 MG SL tablet Place 1 tablet (0.4 mg total) under the tongue every 5 (five) minutes x 3 doses as needed for chest pain. 09/01/11   Jerline Pain, MD  omeprazole (PRILOSEC) 20 MG capsule Take 20 mg by mouth daily.    [provider]  rosuvastatin (CRESTOR) 5 MG tablet Take 1 tablet (5 mg total) by mouth 3 (three) times a week. 05/21/17   Jerline Pain, MD  traMADol (ULTRAM) 50 MG tablet Take 1 tablet (50 mg total) by mouth every 6 (six) hours as needed. Patient taking differently: Take 50 mg by mouth every 6 (six) hours as needed for moderate pain.  07/11/18   Daryll Brod, MD    Allergies    Patient has no known allergies.  Review of Systems   Review of Systems  Constitutional: Negative for fever.  HENT: Negative for sore throat.   Eyes: Negative for visual disturbance.  Respiratory: Positive for shortness of breath.   Cardiovascular: Positive for chest pain.  Gastrointestinal: Negative for abdominal pain.  Genitourinary: Negative for dysuria.  Musculoskeletal: Negative for back pain.  Skin: Negative for rash.  Neurological: Positive for numbness.    Physical Exam Updated Vital Signs BP (!) 147/81   Pulse 77   Temp 98.1 F (36.7 C) (Oral)   Resp 14   Ht 6' (1.829 m)   Wt 106.6 kg   SpO2 98%   BMI 31.87 kg/m   Physical Exam Vitals and nursing note reviewed.  Constitutional:      Appearance: He is well-developed.  HENT:     Head: Normocephalic and atraumatic.  Eyes:     Conjunctiva/sclera: Conjunctivae normal.  Cardiovascular:     Rate and Rhythm: Normal rate and regular rhythm.     Pulses:          Radial pulses are 2+ on the right side and 2+ on the left side.     Heart sounds:  Normal heart sounds. No murmur.  Pulmonary:     Effort: Pulmonary effort is normal. No respiratory distress.     Breath sounds: Normal breath sounds.  Abdominal:     Palpations: Abdomen is soft.     Tenderness: There is no abdominal tenderness.  Musculoskeletal:        General: Normal range of motion.     Cervical back: Neck supple.     Right lower leg: No tenderness.     Left lower leg: No tenderness.  Skin:    General: Skin is warm and dry.     Capillary Refill: Capillary refill takes less than 2 seconds.  Neurological:     General: No focal deficit present.  Mental Status: He is alert.     ED Results / Procedures / Treatments   Labs (all labs ordered are listed, but only abnormal results are displayed) Labs Reviewed  BASIC METABOLIC PANEL - Abnormal; Notable for the following components:      Result Value   Sodium 134 (*)    Chloride 95 (*)    Glucose, Bld 281 (*)    All other components within normal limits  LIPID PANEL - Abnormal; Notable for the following components:   LDL Cholesterol 136 (*)    All other components within normal limits  TROPONIN I (HIGH SENSITIVITY) - Abnormal; Notable for the following components:   Troponin I (High Sensitivity) 135 (*)    All other components within normal limits  TROPONIN I (HIGH SENSITIVITY) - Abnormal; Notable for the following components:   Troponin I (High Sensitivity) 258 (*)    All other components within normal limits  TROPONIN I (HIGH SENSITIVITY) - Abnormal; Notable for the following components:   Troponin I (High Sensitivity) 1,048 (*)    All other components within normal limits  TROPONIN I (HIGH SENSITIVITY) - Abnormal; Notable for the following components:   Troponin I (High Sensitivity) 2,236 (*)    All other components within normal limits  SARS CORONAVIRUS 2 BY RT PCR (HOSPITAL ORDER, [facility])  CBC  HEPARIN LEVEL (UNFRACTIONATED)  HEMOGLOBIN A1C  HEPARIN LEVEL  (UNFRACTIONATED)  CBC  POCT ACTIVATED CLOTTING TIME  POCT ACTIVATED CLOTTING TIME    EKG EKG Interpretation  Date/Time:  Monday Jun 30 2019 07:24:17 EDT Ventricular Rate:  85 PR Interval:  190 QRS Duration: 92 QT Interval:  352 QTC Calculation: 418 R Axis:   58 Text Interpretation: Normal sinus rhythm Incomplete right bundle branch block Possible Anterior infarct , age undetermined Abnormal ECG No significant change since prior 5/20 Confirmed by Aletta Edouard [phone]) on 06/30/2019 8:13:06 AM   Radiology DG Chest 2 View  Result Date: 06/30/2019 CLINICAL DATA:  Pt reports chest pain that radiates to his left arm along with SOB that started on Saturday and has gotten worse. Pt thought it was GERD but took medications without relief. EXAM: CHEST - 2 VIEW COMPARISON:  08/09/2012 FINDINGS: Cardiac silhouette is normal in size and configuration. Normal mediastinal and hilar contours. Clear lungs.  No pleural effusion or pneumothorax. Skeletal structures are intact. IMPRESSION: No active cardiopulmonary disease. Electronically Signed   By: Lajean Manes M.D.   On: 06/30/2019 08:09    Procedures .Critical Care Performed by: Hayden Rasmussen, MD Authorized by: Hayden Rasmussen, MD   Critical care provider statement:    Critical care time (minutes):  45   Critical care time was exclusive of:  Separately billable procedures and treating other patients   Critical care was necessary to treat or prevent imminent or life-threatening deterioration of the following conditions:  Cardiac failure   Critical care was time spent personally by me on the following activities:  Discussions with consultants, evaluation of patient's response to treatment, examination of patient, ordering and performing treatments and interventions, ordering and review of laboratory studies, ordering and review of radiographic studies, pulse oximetry, re-evaluation of patient's condition, obtaining history from patient or  surrogate, review of old charts and development of treatment plan with patient or surrogate   I assumed direction of critical care for this patient from another provider in my specialty: no     (including critical care time)  Medications Ordered in ED Medications  nitroGLYCERIN (NITROSTAT) SL tablet 0.4 mg ( Sublingual MAR Unhold 06/30/19 1746)  heparin ADULT infusion 100 units/mL (25000 units/267mL sodium chloride 0.45%) (0 Units/hr Intravenous Stopped 06/30/19 1614)  0.9% sodium chloride infusion (has no administration in time range)    Followed by  0.9% sodium chloride infusion (has no administration in time range)  heparin bolus via infusion 4,000 Units (4,000 Units Intravenous Bolus from Bag 06/30/19 1115)  aspirin chewable tablet 81 mg (81 mg Oral Given 06/30/19 1555)  0.9 %  sodium chloride infusion (100 mL/hr Intravenous New Bag/Given 06/30/19 1618)    ED Course  I have reviewed the triage vital signs and the nursing notes.  Pertinent labs & imaging results that were available during my care of the patient were reviewed by me and considered in my medical decision making (see chart for details).  Clinical Course as of Jun 30 1802  Mon Jun 30, 2019  [id number] Initial troponin elevated at 135.  Placed consult to cardiology.   [MB]  [id number] Patient's delta troponin rising.  Cardiology has not seen the patient yet.  I have updated the patient on results and have initiated heparin drip.   [MB]  1030 Chest pain currently 2 out of 10.   [MB]    Clinical Course User Index [MB] Hayden Rasmussen, MD   MDM Rules/Calculators/A&P HEAR Score: 5                   This patient complains of chest pain shortness of breath; this involves an extensive number of treatment Options and is a complaint that carries with it a high risk of complications and Morbidity. The differential includes ACS, pneumonia, pneumothorax, vascular, PE, GERD, musculoskeletal  I ordered, reviewed and interpreted labs, which  included normal CBC, chemistry with elevated glucose, troponin elevated and rising, Covid testing negative I ordered medication aspirin and IV heparin I ordered imaging studies which included chest x-ray and I independently    visualized and interpreted imaging which showed no acute disease Additional history obtained from patient's wife Previous records obtained and reviewed in epic including last cardiology notes I consulted cardiology and discussed lab and imaging findings  Critical Interventions: Initiation of heparin and cardiology for urgent catheterization  After the interventions stated above, I reevaluated the patient and found patient to be hemodynamically stable.  Updated patient on results of lab tests and need for admission.   Final Clinical Impression(s) / ED Diagnoses Final diagnoses:  Unstable angina [facility])    Rx / DC Orders ED Discharge Orders    None       Hayden Rasmussen, MD 06/30/19 1807

## 2019-06-30 NOTE — H&P (Addendum)
Cardiology Admission History and Physical:   Patient ID: Zachary Hamilton MRN: [id number]; DOB: 1960/10/24   Admission date: 06/30/2019  Primary Care Provider: Lawerance Cruel, MD Primary Cardiologist: Zachary Furbish, MD  Primary Electrophysiologist:  None   Chief Complaint:  Chest pain  Patient Profile:   Zachary Hamilton is a 59 y.o. male with PMH of non-obstructive CAD (moderate LAD disease on LHC 2014), HTN, HLD, and obesity, who presents with complaints of chest pain.  History of Present Illness:   Mr. Zachary Hamilton was in his usual state of health until a couple days ago when he began experiencing substernal chest pain with radiation to right arm and back. He had associated SOB and weakness but no diaphoresis, dizziness, lightheadedness, N/V, or syncope. He took SL nitro which helped only to have pain return. Pain has been low level constant since Saturday though waxing and waning a bit. He again had symptoms upon waking this morning prompting him to present to the ED for further evaluation.   He was last evaluated by cardiology via a telemedicine visit with Dr. Marlou Hamilton 05/2018, at which time he was doing well from a cardiac standpoint. He reported occasional tachypalpitations for which he was recommended to obtain a heart monitoring watch. He was recommended to follow-up in 1 year - annual visit scheduled 07/21/19. His last ischemic evaluation was a NST in 2016 which was low risk, EF 51%. Heart catheterization in 2014 showed 50% m-dLAD disease (noted that LAD wraps around the apex), 30% OM1 disease, and 20% dRCA disease.   At the time of this evaluation he is chest pain free. He reported an episode of tachycardia to the 150s for 1 minute on Friday which he typically experiences a few times a year. He denied chest pain with this episode. He has had some DOE for the past several days which is unusual for him. He reports that he has not taken his cholesterol medications for the past several months.  Also reports his blood sugars have been elevated on the last several checks though he does not carry a diagnosis of diabetes. No complaints of polyuria or polydipsia. He denies LE edema, orthopnea, PND, dizziness, lightheadedness, syncope, fever, URI symptoms, hematuria, melena, or hematochezia.    ED course: mildly hypertensive on arrival though stable this morning, otherwise VSS. Labs notable for Na 134, K 3.6, Glucose 281, Cr 0.88, CBC wnl, HsTrop 135>258. Initial EKG with sinus rhythm, rate 77 bpm, STE in III and aVL, with non-specific ST-T wave abnormalities; repeat EKG with persistent STE in III, otherwise no STE/D or TWI. CXR without acute findings. He was started on a heparin gtt. Cardiology asked to evaluate.    Past Medical History:  Diagnosis Date   Chronic kidney disease    Coronary artery disease    CTA 09/03/11 - 50-75% distal LAD, calcified prox LAD. NUC stress 09/05/11 - no flow limiting dz.    Dysphagia    GERD (gastroesophageal reflux disease)    Hypercholesteremia     Past Surgical History:  Procedure Laterality Date   ESOPHAGOGASTRODUODENOSCOPY  02/10/2011   Procedure: ESOPHAGOGASTRODUODENOSCOPY (EGD);  Surgeon: Jeryl Columbia, MD;  Location: [facility];  Service: Endoscopy;  Laterality: N/A;   EXCISION METACARPAL MASS Right 07/11/2018   Procedure: EXCISION CYST, DEBRIDEMENT DISTAL INTERPHALANGEAL JOINT RIGHT MIDDLE FINGER;  Surgeon: Daryll Brod, MD;  Location: [facility];  Service: Orthopedics;  Laterality: Right;   LEFT HEART CATHETERIZATION WITH CORONARY ANGIOGRAM Bilateral 08/12/2012   Procedure: LEFT  HEART CATHETERIZATION WITH CORONARY ANGIOGRAM;  Surgeon: Zachary Furbish, MD;  Location: [facility];  Service: Cardiovascular;  Laterality: Bilateral;   NASAL SINUS SURGERY  2000   SAVORY DILATION  02/10/2011   Procedure: SAVORY DILATION;  Surgeon: Jeryl Columbia, MD;  Location: [facility];  Service: Endoscopy;  Laterality: N/A;     Medications Prior  to Admission: Prior to Admission medications   Medication Sig Start Date End Date Taking? Authorizing Provider  aspirin EC 81 MG tablet Take 81 mg by mouth daily.   Yes [provider]  Coenzyme Q10 300 MG CAPS Take 1 capsule by mouth daily.   Yes [provider]  isosorbide mononitrate (IMDUR) 30 MG 24 hr tablet TAKE 1 TABLET BY MOUTH EVERY DAY Patient taking differently: Take 30 mg by mouth daily.  06/26/18  Yes Jerline Pain, MD  losartan-hydrochlorothiazide (HYZAAR) 100-25 MG tablet TAKE 1 TABLET BY MOUTH EVERY DAY Patient taking differently: Take 1 tablet by mouth daily.  07/29/18  Yes Jerline Pain, MD  nitroGLYCERIN (NITROSTAT) 0.4 MG SL tablet Place 1 tablet (0.4 mg total) under the tongue every 5 (five) minutes x 3 doses as needed for chest pain. 09/01/11  Yes Jerline Pain, MD  omeprazole (PRILOSEC) 20 MG capsule Take 20 mg by mouth daily.   Yes [provider]  rosuvastatin (CRESTOR) 5 MG tablet Take 1 tablet (5 mg total) by mouth 3 (three) times a week. 05/21/17  Yes Jerline Pain, MD  traMADol (ULTRAM) 50 MG tablet Take 1 tablet (50 mg total) by mouth every 6 (six) hours as needed. Patient not taking: Reported on 06/30/2019 07/11/18   Daryll Brod, MD     Allergies:   No Known Allergies  Social History:   Social History   Socioeconomic History   Marital status: Married    Spouse name: Not on file   Number of children: Not on file   Years of education: Not on file   Highest education level: Not on file  Occupational History   Not on file  Tobacco Use   Smoking status: Never Smoker   Smokeless tobacco: Never Used   Tobacco comment: grew up with a smoker  Substance and Sexual Activity   Alcohol use: Yes    Alcohol/week: 1.0 standard drinks    Types: 1 Cans of beer per week   Drug use: No   Sexual activity: Not on file  Other Topics Concern   Not on file  Social History Narrative   Not on file   Social Determinants of Health    Financial Resource Strain:    Difficulty of Paying Living Expenses:   Food Insecurity:    Worried About Charity fundraiser in the Last Year:    Arboriculturist in the Last Year:   Transportation Needs:    Film/video editor (Medical):    Lack of Transportation (Non-Medical):   Physical Activity:    Days of Exercise per Week:    Minutes of Exercise per Session:   Stress:    Feeling of Stress :   Social Connections:    Frequency of Communication with Friends and Family:    Frequency of Social Gatherings with Friends and Family:    Attends Religious Services:    Active Member of Clubs or Organizations:    Attends Archivist Meetings:    Marital Status:   Intimate Partner Violence:    Fear of Current or Ex-Partner:  Emotionally Abused:    Physically Abused:    Sexually Abused:     Family History:   The patient's family history includes Breast cancer in his sister; Diabetes in his mother; Heart attack in his father. There is no history of Anesthesia problems.    ROS:  Please see the history of present illness.  All other ROS reviewed and negative.     Physical Exam/Data:   Vitals:   06/30/19 1104 06/30/19 1105 06/30/19 1106 06/30/19 1115  BP:      Pulse: 82 75 80 73  Resp: (!) 21 (!) 22 20 (!) 22  Temp:      TempSrc:      SpO2: 96% 96% 98% 98%  Weight:      Height:       No intake or output data in the 24 hours ending 06/30/19 1217 Last 3 Weights 06/30/2019 07/11/2018 07/03/2018  Weight (lbs) 235 lb 244 lb 4.3 oz 240 lb  Weight (kg) 106.595 kg 110.8 kg 108.863 kg     Body mass index is 31.87 kg/m.  General:  Well nourished, well developed, in no acute distress HEENT: sclera anicteric Neck: no JVD Vascular: No carotid bruits; distal pulses 2+ bilaterally without bruits  Cardiac:  normal S1, S2; RRR; no murmurs, rubs, or gallops Lungs:  clear to auscultation bilaterally, no wheezing, rhonchi or rales  Abd: soft, nontender, no  hepatomegaly  Ext: no edema Musculoskeletal:  No deformities, BUE and BLE strength normal and equal Skin: warm and dry  Neuro:  CNs 2-12 intact, no focal abnormalities noted Psych:  Normal affect    EKG:  sinus rhythm, rate 77 bpm, STE in III and aVL, with non-specific ST-T wave abnormalities; repeat EKG with persistent STE in III, otherwise no STE/D or TWI.  Relevant CV Studies:  Left heart catheterization 2014: ANGIOGRAPHIC DATA:  Left main: Left main bifurcates into the LAD, high ramus/OM, circumflex artery. No angiographically significant disease.  Left anterior descending (LAD): There is a mid to distal LAD lesion of approximately 50%, eccentric, does not appear to be flow-limiting. The LAO cranial view demonstrates what appears to be the most significant stenosis in this region. The other views, LAO cranial views with RAO/AP show some haziness in this region but no significant flow limiting stenosis. Overall lesion is approximately 50%. The LAD then continues to wrap around the apex.  Circumflex artery (CIRC): There are 2 high obtuse marginal branches which are moderate sized caliber, first vessel has 30% ostial stenosis.  Right coronary artery (RCA): Mild distal RCA disease, minor luminal irregularities, 20% at its most in the distal segment. This gives rise to the posterior descending artery and is dominant.  LEFT VENTRICULOGRAM: Left ventricular angiogram was done in the 30 RAO projection and revealed normal left ventricular wall motion and systolic function with an estimated ejection fraction of 55%.  IMPRESSIONS:  1. Mid LAD lesion of approximately 50%, eccentric plaque visual he worst in the LAO cranial view but other views demonstrate what appears to be no flow-limiting coronary artery disease. Minor irregularities in the proximal high obtuse marginal branch and distal RCA. 2. Normal left ventricular systolic function. LVEDP 12 mmHg. Ejection fraction 55%. RECOMMENDATION: Continue  with prevention strategy. He was wondering if simvastatin is causing him some muscular discomfort. He wishes to change to atorvastatin. This is completely reasonable. We may wish to place him on low-dose anti-anginal such as isosorbide to see if this helps. Continue to monitor him.   NST 2016:  Nuclear stress EF: 51%.  Blood pressure demonstrated a hypertensive response to exercise.  There was no ST segment deviation noted during stress.  This is a low risk study.  Findings consistent with ischemia.   Low risk stress nuclear study wtth a small, mild, reversible defect in the basal inferior lateral wall consistent with mild ischemia; EF 51 with normal wall motion; mild LVE.  Laboratory Data:  High Sensitivity Troponin:   Recent Labs  Lab 06/30/19 0739 06/30/19 0922  TROPONINIHS 135* 258*      Chemistry Recent Labs  Lab 06/30/19 0739  NA 134*  K 3.6  CL 95*  CO2 27  GLUCOSE 281*  BUN 15  CREATININE 0.88  CALCIUM 9.5  GFRNONAA >60  GFRAA >60  ANIONGAP 12    No results for input(s): PROT, ALBUMIN, AST, ALT, ALKPHOS, BILITOT in the last 168 hours. Hematology Recent Labs  Lab 06/30/19 0739  WBC 6.7  RBC 5.04  HGB 15.1  HCT 45.0  MCV 89.3  MCH 30.0  MCHC 33.6  RDW 12.2  PLT 261   BNPNo results for input(s): BNP, PROBNP in the last 168 hours.  DDimer No results for input(s): DDIMER in the last 168 hours.   Radiology/Studies:  DG Chest 2 View  Result Date: 06/30/2019 CLINICAL DATA:  Pt reports chest pain that radiates to his left arm along with SOB that started on Saturday and has gotten worse. Pt thought it was GERD but took medications without relief. EXAM: CHEST - 2 VIEW COMPARISON:  08/09/2012 FINDINGS: Cardiac silhouette is normal in size and configuration. Normal mediastinal and hilar contours. Clear lungs.  No pleural effusion or pneumothorax. Skeletal structures are intact. IMPRESSION: No active cardiopulmonary disease. Electronically Signed   By: Lajean Manes M.D.   On: 06/30/2019 08:09       TIMI Risk Score for Unstable Angina or Non-ST Elevation MI:   The patient's TIMI risk score is 6, which indicates a 41% risk of all cause mortality, new or recurrent myocardial infarction or need for urgent revascularization in the next 14 days.   Assessment and Plan:   1. Unstable angina: patient presented with chest pain. Found to have elevated HsTrop 135>258. EKG initially with STE in III and aVL, with continue isolated STE in III on repeat EKG. He has a history of non-obstructive CAD with 50% m-dLAD disease, 30% OM1 disease, and 20% dRCA disease. Possible he has had progression of disease since that time. He is intolerant to Toprol in the past.  - Will plan for LHC to further evaluate symptoms and elevated troponins. The patient understands that risks included but are not limited to stroke (1 in 1000), death (1 in 30), kidney failure [usually temporary] (1 in 500), bleeding (1 in 200), allergic reaction [possibly serious] (1 in 200).  The patient understands and agrees to proceed.  - Will check an echocardiogram - Continue heparin gtt for now - Continue aspirin, statin, and imdur - Will update lipid panel and HgbA1C for risk stratification.  - Consider addition of BBlocker pending LHC findings (intolerant to Toprol in the past)  2. HTN: BP stable - Continue home losartan-HCTZ  3. HLD: No recent lipids on file. He has been off crestor for several months due to intolerance. Also intolerant to atorvastatin and simvastatin in the past - Will update FLP.  - Consider referral to Lipid clinic for PSK9-I at discharge  4. Elevated blood glucose: 281 on labs this morning. Also in the 280s on  BMET 06/2018. - Will check A1C for risk stratification  5. Tachypalpitations: he has episodes 3-4 times a year where his HR increases to the 150s for 1 minute before resolving spontaneously. Possible he is having episodes of SVT? - Continue to monitor on telemetry.    - Consider trial of alternative BBlocker for suppression   6. GERD: on omeprazole at home - Will substitute with pantoprazole in house  Severity of Illness: The appropriate patient status for this patient is INPATIENT. Inpatient status is judged to be reasonable and necessary in order to provide the required intensity of service to ensure the patient's safety. The patient's presenting symptoms, physical exam findings, and initial radiographic and laboratory data in the context of their chronic comorbidities is felt to place them at high risk for further clinical deterioration. Furthermore, it is not anticipated that the patient will be medically stable for discharge from the hospital within 2 midnights of admission. The following factors support the patient status of inpatient.   " The patient's presenting symptoms include chest pain, DOE. " The worrisome physical exam findings include benign cardiopulmonary exam. " The initial radiographic and laboratory data are worrisome because of EKG with STE in inferior leads, improved on repeat, Elevated troponins, elevated glucose levels. " The chronic co-morbidities include HTN, HLD, CAD.   * I certify that at the point of admission it is my clinical judgment that the patient will require inpatient hospital care spanning beyond 2 midnights from the point of admission due to high intensity of service, high risk for further deterioration and high frequency of surveillance required.*    For questions or updates, please contact [facility] Please consult [email or web address] for contact info under        Signed, Abigail Butts, PA-C  06/30/2019 12:17 PM   Personally seen and examined. Agree with above.   59 year old male with previously described nonobstructive coronary artery disease in 2014 here with chest discomfort waxing and waning since Saturday with elevated troponin of approximately 150 to 250.  Prior moderate LAD disease wraparound  LAD.  Interestingly, his original EKG does have some baseline wander however there does appear to be mild ST elevation in lead III and aVF.  Subsequent EKG shows ST elevation in lead III only.  This is a change from his prior EKG.  Having some chest discomfort radiating to his left arm.  GEN: Well nourished, well developed, in no acute distress  HEENT: normal , beard Neck: no JVD, carotid bruits, or masses Cardiac: RRR; no murmurs, rubs, or gallops,no edema  Respiratory:  clear to auscultation bilaterally, normal work of breathing GI: soft, nontender, nondistended, + BS MS: no deformity or atrophy  Skin: warm and dry, no rash Neuro:  Alert and Oriented x 3, Strength and sensation are intact Psych: euthymic mood, full affect  Telemetry-unremarkable EKG personally reviewed as above and interpreted.  A/P:  Non-ST elevation myocardial infarction -Chest discomfort, EKG abnormalities as described above, we will proceed with cardiac catheterization. -Currently on heparin continue with aspirin.  And encourage statin use.  Isosorbide.  Consider beta-blocker post-cath.  Essential hypertension and hyperlipidemia -Has had statin intolerance in the past.  We will go ahead and refer to the lipid clinic for possible PCSK9 at discharge.  Tachycardia paroxysmal -Occasionally his heart rate will increase to 150.  This could be paroxysmal atrial tachycardia.  Beta-blocker could be helpful in the future.  Continue telemetry here.  Zachary Furbish, MD

## 2019-06-30 NOTE — Interval H&P Note (Signed)
Cath Lab Visit (complete for each Cath Lab visit)  Clinical Evaluation Leading to the Procedure:   ACS: Yes.    Non-ACS:    Anginal Classification: CCS III  Anti-ischemic medical therapy: Maximal Therapy (2 or more classes of medications)  Non-Invasive Test Results: No non-invasive testing performed  Prior CABG: No previous CABG      History and Physical Interval Note:  06/30/2019 4:13 PM  Zachary Hamilton  has presented today for surgery, with the diagnosis of Unstable angina.  The various methods of treatment have been discussed with the patient and family. After consideration of risks, benefits and other options for treatment, the patient has consented to  Procedure(s): LEFT HEART CATH AND CORONARY ANGIOGRAPHY (N/A) as a surgical intervention.  The patient's history has been reviewed, patient examined, no change in status, stable for surgery.  I have reviewed the patient's chart and labs.  Questions were answered to the patient's satisfaction.     Belva Crome III

## 2019-06-30 NOTE — ED Triage Notes (Signed)
Pt reports chest pain that radiates to his left arm along with SOB that started on Saturday and has gotten worse. Pt thought it was GERD but took medications without relief.

## 2019-06-30 NOTE — Progress Notes (Signed)
ANTICOAGULATION CONSULT NOTE - Initial Consult  Pharmacy Consult for heparin Indication: chest pain/ACS  No Known Allergies  Patient Measurements: Height: 6' (182.9 cm) Weight: 106.6 kg (235 lb) IBW/kg (Calculated) : 77.6 Heparin Dosing Weight: 100kg  Vital Signs: Temp: 98.1 F (36.7 C) (05/17 0733) Temp Source: Oral (05/17 0733) BP: 120/76 (05/17 1000) Pulse Rate: 80 (05/17 1000)  Labs: Recent Labs    06/30/19 0739 06/30/19 0922  HGB 15.1  --   HCT 45.0  --   PLT 261  --   CREATININE 0.88  --   TROPONINIHS 135* 258*    Estimated Creatinine Clearance: 115.4 mL/min (by C-G formula based on SCr of 0.88 mg/dL).   Medical History: Past Medical History:  Diagnosis Date  . Chronic kidney disease   . Coronary artery disease    CTA 09/03/11 - 50-75% distal LAD, calcified prox LAD. NUC stress 09/05/11 - no flow limiting dz.   Marland Kitchen Dysphagia   . GERD (gastroesophageal reflux disease)   . Hypercholesteremia    Assessment: 3 YOM presenting with CP, not on anticoagulation PTA, CBC wnl.   Goal of Therapy:  Heparin level 0.3-0.7 units/ml Monitor platelets by anticoagulation protocol: Yes   Plan:  Heparin 4000 units IV x 1, and gtt at 1200 units/hr F/u 6 hour heparin level  Bertis Ruddy, PharmD Clinical Pharmacist ED Pharmacist Phone # [phone] 06/30/2019 10:39 AM

## 2019-07-01 ENCOUNTER — Inpatient Hospital Stay (HOSPITAL_COMMUNITY): Payer: BC Managed Care – PPO

## 2019-07-01 DIAGNOSIS — E119 Type 2 diabetes mellitus without complications: Secondary | ICD-10-CM

## 2019-07-01 DIAGNOSIS — I10 Essential (primary) hypertension: Secondary | ICD-10-CM

## 2019-07-01 DIAGNOSIS — I214 Non-ST elevation (NSTEMI) myocardial infarction: Secondary | ICD-10-CM

## 2019-07-01 DIAGNOSIS — R079 Chest pain, unspecified: Secondary | ICD-10-CM

## 2019-07-01 LAB — BASIC METABOLIC PANEL
Anion gap: 12 (ref 5–15)
BUN: 11 mg/dL (ref 6–20)
CO2: 25 mmol/L (ref 22–32)
Calcium: 9.2 mg/dL (ref 8.9–10.3)
Chloride: 99 mmol/L (ref 98–111)
Creatinine, Ser: 0.79 mg/dL (ref 0.61–1.24)
GFR calc Af Amer: >60 mL/min (ref >60)
GFR calc non Af Amer: >60 mL/min (ref >60)
Glucose, Bld: 227 mg/dL — ABNORMAL HIGH (ref 70–99)
Potassium: 3.8 mmol/L (ref 3.5–5.1)
Sodium: 136 mmol/L (ref 135–145)

## 2019-07-01 LAB — ECHOCARDIOGRAM COMPLETE
Height: 72 in
Weight: 3748.53 oz

## 2019-07-01 LAB — HIV ANTIBODY (ROUTINE TESTING W REFLEX): HIV Screen 4th Generation wRfx: NONREACTIVE

## 2019-07-01 LAB — HEMOGLOBIN A1C
Hgb A1c MFr Bld: 10.5 % — ABNORMAL HIGH (ref 4.8–5.6)
Mean Plasma Glucose: 255 mg/dL

## 2019-07-01 LAB — GLUCOSE, CAPILLARY
Glucose-Capillary: 217 mg/dL — ABNORMAL HIGH (ref 70–99)
Glucose-Capillary: 245 mg/dL — ABNORMAL HIGH (ref 70–99)

## 2019-07-01 MED ORDER — JARDIANCE 10 MG PO TABS
10.0000 mg | ORAL_TABLET | Freq: Every day | ORAL | 6 refills | Status: DC
Start: 2019-07-01 — End: 2021-09-30

## 2019-07-01 MED ORDER — CARVEDILOL 3.125 MG PO TABS: 3.1250 mg | ORAL_TABLET | Freq: Two times a day (BID) | ORAL | 6 refills | Status: AC

## 2019-07-01 MED ORDER — INSULIN ASPART 100 UNIT/ML ~~LOC~~ SOLN
0.0000 [IU] | Freq: Three times a day (TID) | SUBCUTANEOUS | Status: DC
  Administered 2019-07-01: 3 [IU] via SUBCUTANEOUS

## 2019-07-01 MED ORDER — NITROGLYCERIN 0.4 MG SL SUBL: 0.4000 mg | SUBLINGUAL_TABLET | SUBLINGUAL | 3 refills | Status: AC | PRN

## 2019-07-01 MED ORDER — CARVEDILOL 3.125 MG PO TABS
3.1250 mg | ORAL_TABLET | Freq: Two times a day (BID) | ORAL | Status: DC
  Administered 2019-07-01: 3.125 mg via ORAL
  Filled 2019-07-01: qty 1

## 2019-07-01 MED ORDER — ISOSORBIDE MONONITRATE ER 60 MG PO TB24: 60.0000 mg | ORAL_TABLET | Freq: Every day | ORAL | Status: DC

## 2019-07-01 MED ORDER — INSULIN ASPART 100 UNIT/ML ~~LOC~~ SOLN: 0.0000 [IU] | Freq: Every day | SUBCUTANEOUS | Status: DC

## 2019-07-01 MED ORDER — TICAGRELOR 90 MG PO TABS: 90.0000 mg | ORAL_TABLET | Freq: Two times a day (BID) | ORAL | 3 refills | Status: DC

## 2019-07-01 MED ORDER — ISOSORBIDE MONONITRATE ER 30 MG PO TB24
30.0000 mg | ORAL_TABLET | Freq: Once | ORAL | Status: AC
  Administered 2019-07-01: 30 mg via ORAL
  Filled 2019-07-01: qty 1

## 2019-07-01 MED ORDER — LIVING WELL WITH DIABETES BOOK
Freq: Once | Status: DC
  Filled 2019-07-01: qty 1

## 2019-07-01 MED ORDER — BLOOD GLUCOSE METER KIT: PACK | 0 refills | Status: AC

## 2019-07-01 MED ORDER — ATORVASTATIN CALCIUM 80 MG PO TABS: 80.0000 mg | ORAL_TABLET | Freq: Every day | ORAL | 6 refills | Status: AC

## 2019-07-01 MED ORDER — ISOSORBIDE MONONITRATE ER 60 MG PO TB24: 60.0000 mg | ORAL_TABLET | Freq: Every day | ORAL | 3 refills | Status: DC

## 2019-07-01 MED ORDER — METFORMIN HCL 500 MG PO TABS: 500.0000 mg | ORAL_TABLET | Freq: Every day | ORAL | 11 refills | Status: DC

## 2019-07-01 MED FILL — JARDIANCE 10 MG TABLET: 10 | 30 days supply | Qty: 30 | Fill #0

## 2019-07-01 MED FILL — NITROGLYCERIN 0.4 MG TAB SL: 0.4 | 8 days supply | Qty: 25 | Fill #0

## 2019-07-01 MED FILL — metFORMIN HCL 500 MG TABS: 500 | 30 days supply | Qty: 30 | Fill #0

## 2019-07-01 MED FILL — ATORVASTATIN CALCIUM 80 MG: 80 | 30 days supply | Qty: 30 | Fill #0

## 2019-07-01 MED FILL — BRILINTA 90 MG TABLET: 90 | 30 days supply | Qty: 60 | Fill #0

## 2019-07-01 MED FILL — ISOSORBIDE MN ER 60 MG TAB: 60 | 30 days supply | Qty: 90 | Fill #0

## 2019-07-01 MED FILL — CARVEDILOL 3.125 MG TABLET: 3.125 | 30 days supply | Qty: 60 | Fill #0

## 2019-07-01 NOTE — Progress Notes (Signed)
  Echocardiogram 2D Echocardiogram has been performed.  Zachary Hamilton 07/01/2019, 9:27 AM

## 2019-07-01 NOTE — Care Management (Signed)
1256 07-01-19 Benefits check submitted for Brilinta and Januvia. Case Manager will follow for cost. Graves-Bigelow, Ocie Cornfield, RN,BSN Case Manager

## 2019-07-01 NOTE — Progress Notes (Signed)
CARDIAC REHAB PHASE I   PRE:  Rate/Rhythm: 84 SR  BP:  Supine: 146/83  Sitting:   Standing:    SaO2: 98%RA  MODE:  Ambulation: 750 ft   POST:  Rate/Rhythm: 97 SR  BP:  Supine:   Sitting: 152/78  Standing:    SaO2: 98%RA 0912-1010 Pt walked 750 ft on RA with no CP. Tolerated well. MI education completed with pt who voiced understanding. Stressed importance of brilinta with stent. Reviewed NTG use, MI restrictions, heart healthy and low carb food choices, walking for ex, and CRP 2. Referred to [facility] program. Pt is very active with walking.  Pt is interested in participating in Virtual Cardiac and Pulmonary Rehab. Pt advised that Virtual Cardiac and Pulmonary Rehab is provided at no cost to the patient.  Checklist:  1. Pt has smart device  ie smartphone and/or ipad for downloading an app  Yes 2. Reliable internet/wifi service    Yes 3. Understands how to use their smartphone and navigate within an app.  Yes   Pt verbalized understanding and is in agreement.    Graylon Good, RN BSN  07/01/2019 10:06 AM

## 2019-07-01 NOTE — Progress Notes (Signed)
Inpatient Diabetes Program Recommendations  AACE/ADA: New Consensus Statement on Inpatient Glycemic Control (2015)  Target Ranges:  Prepandial:   less than 140 mg/dL      Peak postprandial:   less than 180 mg/dL (1-2 hours)      Critically ill patients:  140 - 180 mg/dL   Lab Results  Component Value Date   GLUCAP 245 (H) 07/01/2019   HGBA1C 10.5 (H) 06/30/2019    Review of Glycemic Control Results for Zachary Hamilton, Zachary Hamilton (MRN [id number]) as of 07/01/2019 14:52  Ref. Range 07/01/2019 07:50 07/01/2019 11:07  Glucose-Capillary Latest Ref Range: 70 - 99 mg/dL 217 (H) 245 (H)   Diabetes history:  New onset DM2  Outpatient Diabetes medications:  None  Current orders for Inpatient glycemic control:  Novolog 0-9 tid + 0-5 qhs  Inpatient Diabetes Program Recommendations:     Please consider Jardiance 10 mg  Daily  And Metformin 500 mg daily at discharge.  Patient will need a glucometer order number# [id number]   Note:  Spoke with patient and wife at bedside.  Spoke with pt about new diagnosis. Discussed A1C results with them and explained what an A1C is, basic pathophysiology of DM Type 2, basic home care, basic diabetes diet nutrition principles, importance of checking CBGs and maintaining good CBG control to prevent long-term and short-term complications. Reviewed signs and symptoms of hyperglycemia and hypoglycemia and how to treat hypoglycemia at home. Also reviewed blood sugar goals at home.  RNs to provide ongoing basic DM education at bedside with this patient. Have ordered educational booklet, and DM videos. Have also placed RD consult for DM diet education for this patient.  Reviewed The Plate Method.  Educated patient on CHO's and trying to consume between 60-70 grams of CHO's per meal.    Educated patient on metformin and side effects along with Jardiance and importance of hydrating.  Provided patient with $10 coupon savings card.  Case Manager will provide patient with a 30 day free  card for Jardiance.  Unit clerk is scheduling an appointment with [name] with [facility] on [address] for PCP follow up.  Zachary Hamilton is aware to bring meter to both cardiology appointment and PCP appointment.  Asked him to check CBG's in the am before breakfast and mid afternoon.  Reviewed hypoglycemia symptoms and treatment.   No further questions at this time.  Thank you, Reche Dixon, RN, BSN Diabetes Coordinator Inpatient Diabetes Program [phone] (team pager from 8a-5p)

## 2019-07-01 NOTE — TOC Benefit Eligibility Note (Signed)
Transition of Care [facility]) Benefit Eligibility Note    Patient Details  Name: Zachary Hamilton MRN: [id number] Date of Birth: 1961-01-20   Medication/Dose: Kary Kos   90 MG BID  Covered?: Yes  Tier: 2 Drug  Prescription Coverage [facility]erred [facility]acy: [facility]  Spoke with Person/Company/Phone Number:: MICHELLE  @   [facility] [employer] # [phone] OPT- MEMBER  Co-Pay: $30.00  Prior Approval: No  Deductible: Met(OUT-OF-POCKET-UNMET)  Additional Notes: JANUVIA  25 MG DAILY  50 MG DAILY   100 MG DAILY   COVER- YES CO-PAY- $ 30.00  TIER- 2 DRUG P/A- NO    Memory Argue Phone Number: 07/01/2019, 12:00 PM

## 2019-07-01 NOTE — Discharge Instructions (Signed)
PLEASE REMEMBER TO BRING ALL OF YOUR MEDICATIONS TO EACH OF YOUR FOLLOW-UP OFFICE VISITS.  PLEASE ATTEND ALL SCHEDULED FOLLOW-UP APPOINTMENTS.   Activity: Increase activity slowly as tolerated. You may shower, but no soaking baths (or swimming) for 1 week. No driving for 24 hours. No lifting over 5 lbs for 1 week. No sexual activity for 1 week.   You May Return to Work: in 1 week (if applicable)  Wound Care: You may wash cath site gently with soap and water. Keep cath site clean and dry. If you notice pain, swelling, bleeding or pus at your cath site, please call [phone].   You can start taking metformin 500mg  daily on 07/03/19. This is a delayed start due to an interaction with this medication and the contrast you received during your heart catheterization

## 2019-07-01 NOTE — Progress Notes (Addendum)
Progress Note  Patient Name: Zachary Hamilton Date of Encounter: 07/01/2019  Primary Cardiologist: Candee Furbish, MD   Subjective   He reports feeling much better today. Chest pressure has resolved. No complaints of SOB or LE edema today. He asks if he can possibly be discharged later today.   Inpatient Medications    Scheduled Meds: . aspirin  81 mg Oral Daily  . atorvastatin  80 mg Oral Daily  . heparin  5,000 Units Subcutaneous Q8H  . hydrochlorothiazide  25 mg Oral Daily  . isosorbide mononitrate  30 mg Oral Daily  . losartan  100 mg Oral Daily  . pantoprazole  40 mg Oral Daily  . sodium chloride flush  3 mL Intravenous Q12H  . ticagrelor  90 mg Oral BID   Continuous Infusions: . sodium chloride     PRN Meds: sodium chloride, acetaminophen, nitroGLYCERIN, ondansetron (ZOFRAN) IV, oxyCODONE, sodium chloride flush   Vital Signs    Vitals:   06/30/19 1949 06/30/19 2018 06/30/19 2339 07/01/19 0408  BP: (!) 147/88 130/80 133/80 131/72  Pulse: 79 80 88 71  Resp: 15 (!) 23 17 19   Temp:  98.2 F (36.8 C) 98 F (36.7 C) 98.3 F (36.8 C)  TempSrc:  Oral Oral Oral  SpO2: 98% 96% 99% 97%  Weight:    106.3 kg  Height:        Intake/Output Summary (Last 24 hours) at 07/01/2019 0626 Last data filed at 07/01/2019 0410 Gross per 24 hour  Intake 1025.78 ml  Output 1350 ml  Net -324.22 ml   Filed Weights   06/30/19 0733 07/01/19 0408  Weight: 106.6 kg 106.3 kg    Telemetry    NSR - Personally Reviewed  ECG    NSR, rate 73bpm, no STE/D, no TWI  - Personally Reviewed  Physical Exam   GEN: No acute distress.   Neck: No JVD, no carotid bruits Cardiac: RRR, no murmurs, rubs, or gallops. Right radial cath site C/D/I without hematoma or ecchymosis.  Respiratory: Clear to auscultation bilaterally, no wheezes/ rales/ rhonchi GI: NABS, Soft, nontender, non-distended  MS: No edema; No deformity. Neuro:  Nonfocal, moving all extremities spontaneously Psych: Normal  affect   Labs    Chemistry Recent Labs  Lab 06/30/19 0739 07/01/19 0414  NA 134* 136  K 3.6 3.8  CL 95* 99  CO2 27 25  GLUCOSE 281* 227*  BUN 15 11  CREATININE 0.88 0.79  CALCIUM 9.5 9.2  GFRNONAA >60 >60  GFRAA >60 >60  ANIONGAP 12 12     Hematology Recent Labs  Lab 06/30/19 0739  WBC 6.7  RBC 5.04  HGB 15.1  HCT 45.0  MCV 89.3  MCH 30.0  MCHC 33.6  RDW 12.2  PLT 261    Cardiac EnzymesNo results for input(s): TROPONINI in the last 168 hours. No results for input(s): TROPIPOC in the last 168 hours.   BNPNo results for input(s): BNP, PROBNP in the last 168 hours.   DDimer No results for input(s): DDIMER in the last 168 hours.   Radiology    DG Chest 2 View  Result Date: 06/30/2019 CLINICAL DATA:  Pt reports chest pain that radiates to his left arm along with SOB that started on Saturday and has gotten worse. Pt thought it was GERD but took medications without relief. EXAM: CHEST - 2 VIEW COMPARISON:  08/09/2012 FINDINGS: Cardiac silhouette is normal in size and configuration. Normal mediastinal and hilar contours. Clear lungs.  No pleural  effusion or pneumothorax. Skeletal structures are intact. IMPRESSION: No active cardiopulmonary disease. Electronically Signed   By: Lajean Manes M.D.   On: 06/30/2019 08:09   CARDIAC CATHETERIZATION  Result Date: 06/30/2019  New proximal total occlusion of the RCA and apical LAD.  The RCA fills by collaterals from septal perforators of the LAD.  The apical LAD is not collateralized.  80 to 85% mid LAD reduced to 0% with stenting using a 26 x 2.5 mm Onyx postdilated to 2.75 mm in diameter.  A large first diagonal contains 60 to 70% proximal narrowing  Left main is widely patent  The first obtuse marginal of the circumflex contains 60 to 70% proximal narrowing.  Circumflex is otherwise widely patent.  Left ventriculography reveals inferobasal severe hypokinesis.  EF 40 to 50%.  LVEDP is normal. RECOMMENDATIONS:  Aspirin and  Brilinta x6 to 12 months.  Medical therapy for residual angina.  If residual angina is significant, consider CTO of RCA.  Track cardiac markers and kidney function.  Aggressive risk factor modification.   Cardiac Studies   LHC 06/30/19:  New proximal total occlusion of the RCA and apical LAD.  The RCA fills by collaterals from septal perforators of the LAD.  The apical LAD is not collateralized.  80 to 85% mid LAD reduced to 0% with stenting using a 26 x 2.5 mm Onyx postdilated to 2.75 mm in diameter.  A large first diagonal contains 60 to 70% proximal narrowing  Left main is widely patent  The first obtuse marginal of the circumflex contains 60 to 70% proximal narrowing.  Circumflex is otherwise widely patent.  Left ventriculography reveals inferobasal severe hypokinesis.  EF 40 to 50%.  LVEDP is normal.  RECOMMENDATIONS:   Aspirin and Brilinta x6 to 12 months.  Medical therapy for residual angina.  If residual angina is significant, consider CTO of RCA.  Track cardiac markers and kidney function.  Aggressive risk factor modification.  Patient Profile     59 y.o. male with PMH of non-obstructive CAD (moderate LAD disease on LHC 2014), HTN, HLD, and obesity, who presents with complaints of chest pain. Admitted to cardiology for unstable angina.  Assessment & Plan    1. NSTEMI: patient presented with chest pain. Found to have elevated HsTrop 135>258>1048>2236. EKG initially with STE in III and aVL, with continue isolated STE in III on repeat EKG. LHC revealed new pRCA and apical LAD total occlusions with L>R collaterals supplying RCA, however apical LAD is not collateralized; 80-85% mLAD stenosis managed with PCI/DES; large 1st diagonal and OM1 with 60-70% stenosis medically managed; LV gram with inferobasal severe hypokinesis with EF 40-50% and normal LVEDP. He was started on aspirin and brilinta for 6-12 months with consideration for CTO of RCA if residual angina.   - Will  check an echocardiogram given LV dysfunction noted on LHC - Continue aspirin and brilinta - Continue statin - Continue imdur - Would benefit from addition of BBlocker given CAD and possible ischemic cardiomyopathy - will discuss starting carvedilol vs atenolol with Dr. Marlou Porch (intolerant to metoprolol succinate in the past)  2. HTN: BP stable - Continue home losartan-HCTZ  3. HLD: LDL 136 this admission. Intolerant to rosuvastatin, atorvastatin, and simvastatin  - Will need a referral to Lipid clinic for PSK9-I consideration at discharge  4. New onset DM type 2: BS consistently elevated in the 200s on labs this admission. A1C is 10.5 confirming new diagnosis of DM type 2 - Will place consult to DM coordinator  for assistance with his new onset DM - Will start ISS while inpatient  5. Tachypalpitations: he has episodes 3-4 times a year where his HR increases to the 150s for 1 minute before resolving spontaneously. Possible he is having episodes of SVT? - Continue to monitor on telemetry.  - Consider trial of alternative BBlocker for suppression   6. GERD: on omeprazole at home - Continue pantoprazole in house  For questions or updates, please contact [facility] Please consult [email or web address] for contact info under Cardiology/STEMI.      Signed, Abigail Butts, PA-C  07/01/2019, 6:26 AM   [phone]  Personally seen and examined. Agree with above.  59 year old with progression of coronary artery disease, CTO of RCA and CTO of distal LAD with left to right collateralization in the setting of non-STEMI.  Doing well this morning, ambulating with cardiac rehab. No chest pain, no shortness of breath  GEN: Well nourished, well developed, in no acute distress  HEENT: normal  Neck: no JVD, carotid bruits, or masses Cardiac: RRR; no murmurs, rubs, or gallops,no edema  Respiratory:  clear to auscultation bilaterally, normal work of breathing GI: soft, nontender, nondistended,  + BS MS: no deformity or atrophy  Skin: warm and dry, no rash Neuro:  Alert and Oriented x 3, Strength and sensation are intact Psych: euthymic mood, full affect  Non-STEMI -Peak troponin 2200. - Diagnostic Dominance: Right    LAD stent placed to 85% lesion.  Essential hypertension -Continue with losartan HCTZ  Hyperlipidemia -LDL 136.  Has had some statin intolerances in the past.  Currently on atorvastatin.  He is willing to try again.  I do think that we need a close follow-up with the lipid clinic for PCSK9 inhibitor.  I am worried that he will progress further without good LDL control.  New onset type 2 diabetes -Hemoglobin A1c 10.5 confirming diagnosis.  Diabetes coordinator.  Visit with primary care physician.  He will likely need Metformin.  May benefit from SGLT2 inhibitor for cardiac benefit as well.  Okay for discharge.  Echo read currently pending.  EF seems preserved. Close follow up in May 27.   Candee Furbish, MD

## 2019-07-01 NOTE — Plan of Care (Signed)
  Problem: Clinical Measurements: Goal: Ability to maintain clinical measurements within normal limits will improve Outcome: Progressing   Problem: Clinical Measurements: Goal: Cardiovascular complication will be avoided Outcome: Progressing   

## 2019-07-01 NOTE — Discharge Summary (Addendum)
Discharge Summary    Patient ID: NEALE MARZETTE MRN: [id number]; DOB: 1960/07/25  Admit date: 06/30/2019 Discharge date: 07/01/2019  Primary Care Provider: Lawerance Cruel, MD  Primary Cardiologist: Candee Furbish, MD Primary Electrophysiologist:  None   Discharge Diagnoses    Principal Problem:   Non-ST elevation (NSTEMI) myocardial infarction [facility]) Active Problems:   Hyperlipidemia   GERD (gastroesophageal reflux disease)   CAD (coronary artery disease)   Unstable angina ([place])   DM type 2, goal HbA1c < 7% ([place])   Hypertension    Diagnostic Studies/Procedures    LHC 06/30/19:  New proximal total occlusion of the RCA and apical LAD. The RCA fills by collaterals from septal perforators of the LAD. The apical LAD is not collateralized.  80 to 85% mid LAD reduced to 0% with stenting using a 26 x 2.5 mm Onyx postdilated to 2.75 mm in diameter. A large first diagonal contains 60 to 70% proximal narrowing  Left main is widely patent  The first obtuse marginal of the circumflex contains 60 to 70% proximal narrowing. Circumflex is otherwise widely patent.  Left ventriculography reveals inferobasal severe hypokinesis. EF 40 to 50%. LVEDP is normal.  RECOMMENDATIONS:   Aspirin and Brilinta x6 to 12 months.  Medical therapy for residual angina. If residual angina is significant, consider CTO of RCA.  Track cardiac markers and kidney function.  Aggressive risk factor modification.  _____________   History of Present Illness     CLEO VILLAMIZAR is a 59 y.o. male with PMH of non-obstructive CAD (moderate LAD disease on LHC 2014), HTN, HLD, and obesity, who presents with complaints of chest pain.  Mr. Rippeon was in his usual state of health until a couple days ago when he began experiencing substernal chest pain with radiation to right arm and back. He had associated SOB and weakness but no diaphoresis, dizziness, lightheadedness, N/V, or syncope. He took SL  nitro which helped only to have pain return. Pain has been low level constant since Saturday though waxing and waning a bit. He again had symptoms upon waking this morning prompting him to present to the ED for further evaluation.   He was last evaluated by cardiology via a telemedicine visit with Dr. Marlou Porch 05/2018, at which time he was doing well from a cardiac standpoint. He reported occasional tachypalpitations for which he was recommended to obtain a heart monitoring watch. He was recommended to follow-up in 1 year - annual visit scheduled 07/21/19. His last ischemic evaluation was a NST in 2016 which was low risk, EF 51%. Heart catheterization in 2014 showed 50% m-dLAD disease (noted that LAD wraps around the apex), 30% OM1 disease, and 20% dRCA disease.   At the time of this evaluation he is chest pain free. He reported an episode of tachycardia to the 150s for 1 minute on Friday which he typically experiences a few times a year. He denied chest pain with this episode. He has had some DOE for the past several days which is unusual for him. He reports that he has not taken his cholesterol medications for the past several months. Also reports his blood sugars have been elevated on the last several checks though he does not carry a diagnosis of diabetes. No complaints of polyuria or polydipsia. He denies LE edema, orthopnea, PND, dizziness, lightheadedness, syncope, fever, URI symptoms, hematuria, melena, or hematochezia.    ED course: mildly hypertensive on arrival though stable this morning, otherwise VSS. Labs notable for Na 134, K 3.6,  Glucose 281, Cr 0.88, CBC wnl, HsTrop 135>258. Initial EKG with sinus rhythm, rate 77 bpm, STE in III and aVL, with non-specific ST-T wave abnormalities; repeat EKG with persistent STE in III, otherwise no STE/D or TWI. CXR without acute findings. He was started on a heparin gtt. Cardiology asked to evaluate.    Hospital Course     Consultants: DM coordinator    1. NSTEMI:patient presented with chest pain. Found to have elevated HsTrop 135>258>1048>2236. EKG initially with STE in III and aVL, with continue isolated STE in III on repeat EKG. LHC revealed new pRCA and apical LAD total occlusions with L>R collaterals supplying RCA, however apical LAD is not collateralized; 80-85% mLAD stenosis managed with PCI/DES; large 1st diagonal and OM1 with 60-70% stenosis medically managed; LV gram with inferobasal severe hypokinesis with EF 40-50% and normal LVEDP. He was started on aspirin and brilinta for 6-12 months with consideration for CTO of RCA if residual angina. Echo showed EF 60-65%, G1DD, no RWMA, and trivial MR. He was started on low dose carvedilol and imdur increased to 62m daily.  - Will check an echocardiogram given LV dysfunction noted on LHC - Continue aspirin and brilinta - Continue statin - Continue imdur and carvedilol  2. HTN:BP stable throughout admission. Carvedilol added given progressive CAD. Imdur increased to 69mdaily - Continue losartan-HCTZ, carvedilol, and imdur  3. HLD:LDL 136 this admission. Intolerant to rosuvastatin, atorvastatin, and simvastatinin the past, though willing to trial atorvastatin again - Continue atorvastatin 8051maily for now - Patient is scheduled to see the Lipid clinic for PSK9-I consideration 07/10/19 at 10:30am, following his TOC visit with Dr. SkaMarlou Porchrlier that morning.   4. New onset DM type 2:BS consistently elevated in the 200s on labs this admission. A1C is 10.5 confirming new diagnosis of DM type 2. DM coordinator consulted. Maintained on ISS during admission and discharged home on jardiance 47m41mily and metformin 500mg35mly. Rx for blood glucose monitor sent as well.  - Continue meformin and jardiance - Will need BMET at outpatient follow-up visit. - Encouraged close outpatient follow-up with his PCP for ongoing monitoring/management.  5.Tachypalpitations:he has episodes 3-4 times a  year where his HR increases to the 150s for 1 minute before resolving spontaneously. Possible he is having episodes of SVT? Started on carvedilol this admission - Continue carvedilol  6.GERD:on omeprazole at home - Continue omeprazole  Did the patient have an acute coronary syndrome (MI, NSTEMI, STEMI, etc) this admission?:  Yes                               AHA/ACC Clinical Performance & Quality Measures: 1. Aspirin prescribed? - Yes 2. ADP Receptor Inhibitor (Plavix/Clopidogrel, Brilinta/Ticagrelor or Effient/Prasugrel) prescribed (includes medically managed patients)? - Yes 3. Beta Blocker prescribed? - Yes 4. High Intensity Statin (Lipitor 40-80mg 26mrestor 20-40mg) 24mcribed? - Yes 5. EF assessed during THIS hospitalization? - Yes 6. For EF <40%, was ACEI/ARB prescribed? - Not Applicable (EF >/= 40%) 7.62%r EF <40%, Aldosterone Antagonist (Spironolactone or Eplerenone) prescribed? - Not Applicable (EF >/= 40%) 8.37%rdiac Rehab Phase II ordered (Included Medically managed Patients)? - Yes   _____________  Discharge Vitals Blood pressure 113/63, pulse 76, temperature 98.8 F (37.1 C), temperature source Oral, resp. rate 18, height 6' (1.829 m), weight 106.3 kg, SpO2 97 %.  Filed Weights   06/30/19 0733 07/01/19 0408  Weight: 106.6 kg 106.3 kg  Labs & Radiologic Studies    CBC Recent Labs    06/30/19 0739  WBC 6.7  HGB 15.1  HCT 45.0  MCV 89.3  PLT 481   Basic Metabolic Panel Recent Labs    06/30/19 0739 07/01/19 0414  NA 134* 136  K 3.6 3.8  CL 95* 99  CO2 27 25  GLUCOSE 281* 227*  BUN 15 11  CREATININE 0.88 0.79  CALCIUM 9.5 9.2   Liver Function Tests No results for input(s): AST, ALT, ALKPHOS, BILITOT, PROT, ALBUMIN in the last 72 hours. No results for input(s): LIPASE, AMYLASE in the last 72 hours. High Sensitivity Troponin:   Recent Labs  Lab 06/30/19 0739 06/30/19 0922 06/30/19 1252 06/30/19 1456  TROPONINIHS 135* 258* 1,048* 2,236*      BNP Invalid input(s): POCBNP D-Dimer No results for input(s): DDIMER in the last 72 hours. Hemoglobin A1C Recent Labs    06/30/19 1253  HGBA1C 10.5*   Fasting Lipid Panel Recent Labs    06/30/19 1252  CHOL 199  HDL 46  LDLCALC 136*  TRIG 83  CHOLHDL 4.3   Thyroid Function Tests No results for input(s): TSH, T4TOTAL, T3FREE, THYROIDAB in the last 72 hours.  Invalid input(s): FREET3 _____________  DG Chest 2 View  Result Date: 06/30/2019 CLINICAL DATA:  Pt reports chest pain that radiates to his left arm along with SOB that started on Saturday and has gotten worse. Pt thought it was GERD but took medications without relief. EXAM: CHEST - 2 VIEW COMPARISON:  08/09/2012 FINDINGS: Cardiac silhouette is normal in size and configuration. Normal mediastinal and hilar contours. Clear lungs.  No pleural effusion or pneumothorax. Skeletal structures are intact. IMPRESSION: No active cardiopulmonary disease. Electronically Signed   By: Lajean Manes M.D.   On: 06/30/2019 08:09   CARDIAC CATHETERIZATION  Result Date: 06/30/2019  New proximal total occlusion of the RCA and apical LAD.  The RCA fills by collaterals from septal perforators of the LAD.  The apical LAD is not collateralized.  80 to 85% mid LAD reduced to 0% with stenting using a 26 x 2.5 mm Onyx postdilated to 2.75 mm in diameter.  A large first diagonal contains 60 to 70% proximal narrowing  Left main is widely patent  The first obtuse marginal of the circumflex contains 60 to 70% proximal narrowing.  Circumflex is otherwise widely patent.  Left ventriculography reveals inferobasal severe hypokinesis.  EF 40 to 50%.  LVEDP is normal. RECOMMENDATIONS:  Aspirin and Brilinta x6 to 12 months.  Medical therapy for residual angina.  If residual angina is significant, consider CTO of RCA.  Track cardiac markers and kidney function.  Aggressive risk factor modification.  ECHOCARDIOGRAM COMPLETE  Result Date: 07/01/2019     ECHOCARDIOGRAM REPORT   Patient Name:   IZIAH CATES Date of Exam: 07/01/2019 Medical Rec #:  [id number]        Height:       72.0 in Accession #:    [id number]       Weight:       234.3 lb Date of Birth:  28-Apr-1960        BSA:          2.279 m Patient Age:    38 years         BP:           132/81 mmHg Patient Gender: M                HR:  74 bpm. Exam Location:  Inpatient Procedure: 2D Echo, Cardiac Doppler and Color Doppler Indications:    Chest pain  History:        Patient has no prior history of Echocardiogram examinations.                 CAD, Signs/Symptoms:Chest Pain; Risk Factors:Hypertension,                 Dyslipidemia and Diabetes.  Sonographer:    Dustin Flock Referring Phys: [id number] [place]  1. Left ventricular ejection fraction, by estimation, is 60 to 65%. The left ventricle has normal function. The left ventricle has no regional wall motion abnormalities. There is mild left ventricular hypertrophy. Left ventricular diastolic parameters are consistent with Grade I diastolic dysfunction (impaired relaxation).  2. Right ventricular systolic function is normal. The right ventricular size is normal.  3. The mitral valve is grossly normal. Trivial mitral valve regurgitation.  4. The aortic valve is tricuspid. Aortic valve regurgitation is not visualized.  5. The inferior vena cava is normal in size with greater than 50% respiratory variability, suggesting right atrial pressure of 3 mmHg. FINDINGS  Left Ventricle: Left ventricular ejection fraction, by estimation, is 60 to 65%. The left ventricle has normal function. The left ventricle has no regional wall motion abnormalities. The left ventricular internal cavity size was normal in size. There is  mild left ventricular hypertrophy. Left ventricular diastolic parameters are consistent with Grade I diastolic dysfunction (impaired relaxation). Normal left ventricular filling pressure. Right Ventricle: The right  ventricular size is normal. No increase in right ventricular wall thickness. Right ventricular systolic function is normal. Left Atrium: Left atrial size was normal in size. Right Atrium: Right atrial size was normal in size. Pericardium: There is no evidence of pericardial effusion. Mitral Valve: The mitral valve is grossly normal. Trivial mitral valve regurgitation. Tricuspid Valve: The tricuspid valve is grossly normal. Tricuspid valve regurgitation is trivial. Aortic Valve: The aortic valve is tricuspid. Aortic valve regurgitation is not visualized. Pulmonic Valve: The pulmonic valve was normal in structure. Pulmonic valve regurgitation is not visualized. Aorta: The aortic root and ascending aorta are structurally normal, with no evidence of dilitation. Venous: The inferior vena cava is normal in size with greater than 50% respiratory variability, suggesting right atrial pressure of 3 mmHg. IAS/Shunts: No atrial level shunt detected by color flow Doppler.  LEFT VENTRICLE PLAX 2D LVIDd:         5.10 cm  Diastology LVIDs:         3.35 cm  LV e' lateral:   8.92 cm/s LV PW:         1.20 cm  LV E/e' lateral: 6.2 LV IVS:        1.10 cm  LV e' medial:    10.30 cm/s LVOT diam:     2.20 cm  LV E/e' medial:  5.4 LV SV:         91 LV SV Index:   40 LVOT Area:     3.80 cm  RIGHT VENTRICLE RV Basal diam:  3.30 cm RV S prime:     13.70 cm/s TAPSE (M-mode): 3.0 cm LEFT ATRIUM             Index       RIGHT ATRIUM           Index LA diam:        3.80 cm 1.67 cm/m  RA Area:  17.20 cm LA Vol (A2C):   49.4 ml 21.68 ml/m RA Volume:   45.40 ml  19.92 ml/m LA Vol (A4C):   47.8 ml 20.98 ml/m LA Biplane Vol: 49.4 ml 21.68 ml/m  AORTIC VALVE LVOT Vmax:   104.00 cm/s LVOT Vmean:  69.800 cm/s LVOT VTI:    0.240 m  AORTA Ao Root diam: 3.30 cm MITRAL VALVE MV Area (PHT): 2.80 cm    SHUNTS MV Decel Time: 271 msec    Systemic VTI:  0.24 m MV E velocity: 55.70 cm/s  Systemic Diam: 2.20 cm MV A velocity: 81.80 cm/s MV E/A ratio:  0.68  Lyman Bishop MD Electronically signed by Lyman Bishop MD Signature Date/Time: 07/01/2019/10:04:19 AM    Final    Disposition   Pt is being discharged home today in good condition.  Follow-up Plans & Appointments    Follow-up Information    Jerline Pain, MD Follow up on 07/10/2019.   Specialty: Cardiology Why: Please arrive 15 minutes early for your 9:20am post-hospital cardiology follow-up appointment. You will also have a visit with our pharmacist at 10:30am to discuss additional cholesterol medications.  Contact information: [address] [place] [address] [phone]        Lawerance Cruel, MD In 2 weeks.   Specialty: Family Medicine Why: Follow-up appointment is Monday, July 21, 2019 at 11:15am Contact information: [place] [address] [phone]          Discharge Instructions    AMB Referral to Advanced Lipid Disorders Clinic   Complete by: As directed    Internal Lipid Clinic Referral Scheduling  Internal lipid clinic referrals are providers within [facility], who wish to refer established patients for routine management (help in starting PCSK9 inhibitor therapy) or advanced therapies.  Internal MD referral criteria:              1. All patients with LDL>190 mg/dL  2. All patients with Triglycerides >500 mg/dL  3. Patients with suspected or confirmed heterozygous familial hyperlipidemia (HeFH) or homozygous familial hyperlipidemia (HoFH)  4. Patients with family history of suspicious for genetic dyslipidemia desiring genetic testing  5. Patients refractory to standard guideline based therapy  6. Patients with statin intolerance (failed 2 statins, one of which must be a high potency statin)  7. Patients who the provider desires to be seen by MD   Internal PharmD referral criteria:   1. Follow-up patients for medication management  2. Follow-up for compliance monitoring  3. Patients for drug education  4. Patients  with statin intolerance  5. PCSK9 inhibitor education and prior authorization approvals  6. Patients with triglycerides <500 mg/dL  External Lipid Clinic Referral  External lipid clinic referrals are for providers outside of [facility], considered new clinic patients - automatically routed to MD schedule   Amb Referral to Cardiac Rehabilitation   Complete by: As directed    Diagnosis:  NSTEMI Coronary Stents     After initial evaluation and assessments completed: Virtual Based Care may be provided alone or in conjunction with Phase 2 Cardiac Rehab based on patient barriers.: Yes      Discharge Medications   Allergies as of 07/01/2019   No Known Allergies     Medication List    TAKE these medications   aspirin EC 81 MG tablet Take 81 mg by mouth daily.   atorvastatin 80 MG tablet Commonly known as: LIPITOR Take 1 tablet (80 mg total) by mouth daily. Start  taking on: Jul 02, 2019   blood glucose meter kit and supplies Dispense based on patient and insurance preference. Use up to four times daily as directed. (FOR ICD-10 E10.9, E11.9).   carvedilol 3.125 MG tablet Commonly known as: COREG Take 1 tablet (3.125 mg total) by mouth 2 (two) times daily with a meal.   Coenzyme Q10 300 MG Caps Take 1 capsule by mouth daily.   isosorbide mononitrate 60 MG 24 hr tablet Commonly known as: IMDUR Take 1 tablet (60 mg total) by mouth daily. Start taking on: Jul 02, 2019   Jardiance 10 MG Tabs tablet Generic drug: empagliflozin Take 10 mg by mouth daily before breakfast.   losartan-hydrochlorothiazide 100-25 MG tablet Commonly known as: HYZAAR TAKE 1 TABLET BY MOUTH EVERY DAY   metFORMIN 500 MG tablet Commonly known as: Glucophage Take 1 tablet (500 mg total) by mouth daily with breakfast.   nitroGLYCERIN 0.4 MG SL tablet Commonly known as: NITROSTAT Place 1 tablet (0.4 mg total) under the tongue every 5 (five) minutes x 3 doses as needed for chest pain.   omeprazole  20 MG capsule Commonly known as: PRILOSEC Take 20 mg by mouth daily.   ticagrelor 90 MG Tabs tablet Commonly known as: BRILINTA Take 1 tablet (90 mg total) by mouth 2 (two) times daily.          Outstanding Labs/Studies   BMET at follow-up  Duration of Discharge Encounter   Greater than 30 minutes including physician time.  Signed, Abigail Butts, PA-C 07/01/2019, 3:26 PM   Personally seen and examined. Agree with above.   59 year old with progression of coronary artery disease, CTO of RCA and CTO of distal LAD with left to right collateralization in the setting of non-STEMI.  Doing well this morning, ambulating with cardiac rehab. No chest pain, no shortness of breath  GEN: Well nourished, well developed, in no acute distress  HEENT: normal  Neck: no JVD, carotid bruits, or masses Cardiac: RRR; no murmurs, rubs, or gallops,no edema  Respiratory:  clear to auscultation bilaterally, normal work of breathing GI: soft, nontender, nondistended, + BS MS: no deformity or atrophy  Skin: warm and dry, no rash Neuro:  Alert and Oriented x 3, Strength and sensation are intact Psych: euthymic mood, full affect  Non-STEMI -Peak troponin 2200. - Diagnostic Dominance: Right    LAD stent placed to 85% lesion.  Essential hypertension -Continue with losartan HCTZ  Hyperlipidemia -LDL 136.  Has had some statin intolerances in the past.  Currently on atorvastatin.  He is willing to try again.  I do think that we need a close follow-up with the lipid clinic for PCSK9 inhibitor.  I am worried that he will progress further without good LDL control.  New onset type 2 diabetes -Hemoglobin A1c 10.5 confirming diagnosis.  Diabetes coordinator.  Visit with primary care physician.  He will likely need Metformin.  May benefit from SGLT2 inhibitor for cardiac benefit as well.  Okay for discharge.  Echo read currently pending.  EF seems preserved. Close follow up in May 27.    Candee Furbish, MD

## 2019-07-01 NOTE — Progress Notes (Signed)
  RD consulted for nutrition education regarding new onset diabetes.   Lab Results  Component Value Date   HGBA1C 10.5 (H) 06/30/2019    RD provided "Heart Healthy Consistent Carbohydrate Nutrition Therapy" handout from the Academy of Nutrition and Dietetics. Discussed different food groups and their effects on blood sugar, emphasizing carbohydrate-containing foods. Provided list of carbohydrates and recommended serving sizes of common foods.  Discussed importance of controlled and consistent carbohydrate intake throughout the day. Provided examples of ways to balance meals/snacks and encouraged intake of high-fiber, whole grain complex carbohydrates.  Provided examples on ways to decrease sodium and fat intake in diet. Discouraged intake of processed foods and use of salt shaker. Teach back method used.  Expect good compliance.  Body mass index is 31.77 kg/m. Pt meets criteria for obesity based on current BMI.  Current diet order is heart healthy, patient reports consuming 100% of meals at this time. Labs and medications reviewed. No further nutrition interventions warranted at this time. RD contact information provided. If additional nutrition issues arise, please re-consult RD.  Zachary Hamilton, RD, LDN, CNSC Please refer to [facility] for contact information.

## 2019-07-02 ENCOUNTER — Telehealth (HOSPITAL_COMMUNITY): Payer: Self-pay

## 2019-07-02 LAB — HEMOGLOBIN A1C
Hgb A1c MFr Bld: 10.4 % — ABNORMAL HIGH (ref 4.8–5.6)
Mean Plasma Glucose: 252 mg/dL

## 2019-07-02 NOTE — Telephone Encounter (Signed)
Called patient to see if he is interested in the Cardiac Rehab Program. Patient expressed interest. Explained scheduling process and went over insurance, patient verbalized understanding. Will contact patient for scheduling once f/u has been completed.  °

## 2019-07-02 NOTE — Telephone Encounter (Signed)
Pt insurance is active and benefits verified through [employer]. Co-pay $0.00, DED $1,250.00/$1,250.00 met, out of pocket $4,890.00/$1,452.21 met, co-insurance 20%. No pre-authorization required. Passport, 07/02/19 @ 3:15PM, GML#[id number]  2ndary insurance is active and benefits verified through [employer].Marland Kitchen Co-pay $0.00, DED $400.00/$0.00 met, out of pocket $2,500.00/$0.00 met, co-insurance 15%. No pre-authorization required. Passport, 07/02/19 @ 3:22PM, HEB#[id number]  Will contact patient to see if he is interested in the Cardiac Rehab Program. If interested, patient will need to complete follow up appt. Once completed, patient will be contacted for scheduling upon review by the RN Navigator.

## 2019-07-10 ENCOUNTER — Ambulatory Visit (INDEPENDENT_AMBULATORY_CARE_PROVIDER_SITE_OTHER): Payer: BC Managed Care – PPO | Admitting: Cardiology

## 2019-07-10 ENCOUNTER — Ambulatory Visit (INDEPENDENT_AMBULATORY_CARE_PROVIDER_SITE_OTHER): Payer: BC Managed Care – PPO | Admitting: Pharmacist

## 2019-07-10 ENCOUNTER — Encounter: Payer: Self-pay | Admitting: Cardiology

## 2019-07-10 ENCOUNTER — Other Ambulatory Visit: Payer: Self-pay

## 2019-07-10 VITALS — BP 110/76 | HR 66 | Ht 72.0 in | Wt 224.0 lb

## 2019-07-10 DIAGNOSIS — I10 Essential (primary) hypertension: Secondary | ICD-10-CM

## 2019-07-10 DIAGNOSIS — E7800 Pure hypercholesterolemia, unspecified: Secondary | ICD-10-CM

## 2019-07-10 DIAGNOSIS — I2510 Atherosclerotic heart disease of native coronary artery without angina pectoris: Secondary | ICD-10-CM | POA: Diagnosis not present

## 2019-07-10 DIAGNOSIS — I2583 Coronary atherosclerosis due to lipid rich plaque: Secondary | ICD-10-CM

## 2019-07-10 DIAGNOSIS — I214 Non-ST elevation (NSTEMI) myocardial infarction: Secondary | ICD-10-CM | POA: Diagnosis not present

## 2019-07-10 DIAGNOSIS — E119 Type 2 diabetes mellitus without complications: Secondary | ICD-10-CM | POA: Diagnosis not present

## 2019-07-10 NOTE — Progress Notes (Signed)
Cardiology Office Note:    Date:  07/10/2019   ID:  Zachary Hamilton, DOB 1960/09/10, MRN [id number]  PCP:  Lawerance Cruel, MD  Cardiologist:  Candee Furbish, MD  Electrophysiologist:  None   Referring MD: Lawerance Cruel, MD     History of Present Illness:    Zachary Hamilton is a 59 y.o. male post cath: Non-STEMI.  Troponin increased to 2200.  There were some ST elevation noted in 3 and aVL.  LHC 06/30/19:  New proximal total occlusion of the RCA and apical LAD. The RCA fills by collaterals from septal perforators of the LAD. The apical LAD is not collateralized.  80 to 85% mid LAD reduced to 0% with stenting using a 26 x 2.5 mm Onyx postdilated to 2.75 mm in diameter. A large first diagonal contains 60 to 70% proximal narrowing  Left main is widely patent  The first obtuse marginal of the circumflex contains 60 to 70% proximal narrowing. Circumflex is otherwise widely patent.  Left ventriculography reveals inferobasal severe hypokinesis. EF 40 to 50%. LVEDP is normal.  RECOMMENDATIONS:   Aspirin and Brilinta x6 to 12 months.  Medical therapy for residual angina. If residual angina is significant, consider CTO of RCA.  Track cardiac markers and kidney function.  Aggressive risk factor modification.  Echo EF 60 to 65%. Started on low-dose carvedilol and isosorbide increased to 60.  He was discharged on the hospital on 07/01/2019.  He was experiencing substernal chest discomfort with radiation to his right arm and back.  Shortness of breath weakness.  Nitroglycerin helped.  Prior stress test in 2016 was low risk EF 51. Heart catheterization 2014 showed 50% mid LAD wrapping around apex with mild OM and RCA disease.    Past Medical History:  Diagnosis Date  . Chronic kidney disease   . Coronary artery disease    CTA 09/03/11 - 50-75% distal LAD, calcified prox LAD. NUC stress 09/05/11 - no flow limiting dz.   . Diabetes mellitus without complication (HCC)     NEW ONSET TYPE 2 PER PATIENT   . Dysphagia   . GERD (gastroesophageal reflux disease)   . Hypercholesteremia     Past Surgical History:  Procedure Laterality Date  . CARDIAC CATHETERIZATION  06/30/2019  . CORONARY STENT INTERVENTION N/A 06/30/2019   Procedure: CORONARY STENT INTERVENTION;  Surgeon: Belva Crome, MD;  Location: [facility];  Service: Cardiovascular;  Laterality: N/A;  . ESOPHAGOGASTRODUODENOSCOPY  02/10/2011   Procedure: ESOPHAGOGASTRODUODENOSCOPY (EGD);  Surgeon: Jeryl Columbia, MD;  Location: [facility];  Service: Endoscopy;  Laterality: N/A;  . EXCISION METACARPAL MASS Right 07/11/2018   Procedure: EXCISION CYST, DEBRIDEMENT DISTAL INTERPHALANGEAL JOINT RIGHT MIDDLE FINGER;  Surgeon: Daryll Brod, MD;  Location: [facility];  Service: Orthopedics;  Laterality: Right;  . LEFT HEART CATH AND CORONARY ANGIOGRAPHY N/A 06/30/2019   Procedure: LEFT HEART CATH AND CORONARY ANGIOGRAPHY;  Surgeon: Belva Crome, MD;  Location: [facility];  Service: Cardiovascular;  Laterality: N/A;  . LEFT HEART CATHETERIZATION WITH CORONARY ANGIOGRAM Bilateral 08/12/2012   Procedure: LEFT HEART CATHETERIZATION WITH CORONARY ANGIOGRAM;  Surgeon: Candee Furbish, MD;  Location: [facility];  Service: Cardiovascular;  Laterality: Bilateral;  . NASAL SINUS SURGERY  2000  . SAVORY DILATION  02/10/2011   Procedure: SAVORY DILATION;  Surgeon: Jeryl Columbia, MD;  Location: [facility];  Service: Endoscopy;  Laterality: N/A;    Current Medications: Current Meds  Medication Sig  .  Accu-Chek Softclix Lancets lancets   . aspirin EC 81 MG tablet Take 81 mg by mouth daily.  Marland Kitchen atorvastatin (LIPITOR) 80 MG tablet Take 1 tablet (80 mg total) by mouth daily.  . blood glucose meter kit and supplies Dispense based on patient and insurance preference. Use up to four times daily as directed. (FOR ICD-10 E10.9, E11.9).  . carvedilol (COREG) 3.125 MG tablet Take 1 tablet (3.125 mg total)  by mouth 2 (two) times daily with a meal.  . Coenzyme Q10 300 MG CAPS Take 1 capsule by mouth daily.  . empagliflozin (JARDIANCE) 10 MG TABS tablet Take 10 mg by mouth daily before breakfast.  . isosorbide mononitrate (IMDUR) 60 MG 24 hr tablet Take 1 tablet (60 mg total) by mouth daily.  Marland Kitchen losartan-hydrochlorothiazide (HYZAAR) 100-25 MG tablet TAKE 1 TABLET BY MOUTH EVERY DAY  . metFORMIN (GLUCOPHAGE) 500 MG tablet Take 1 tablet (500 mg total) by mouth daily with breakfast.  . nitroGLYCERIN (NITROSTAT) 0.4 MG SL tablet Place 1 tablet (0.4 mg total) under the tongue every 5 (five) minutes x 3 doses as needed for chest pain.  Marland Kitchen omeprazole (PRILOSEC) 20 MG capsule Take 20 mg by mouth daily.  . ticagrelor (BRILINTA) 90 MG TABS tablet Take 1 tablet (90 mg total) by mouth 2 (two) times daily.     Allergies:   Patient has no known allergies.   Social History   Socioeconomic History  . Marital status: Married    Spouse name: Not on file  . Number of children: Not on file  . Years of education: Not on file  . Highest education level: Not on file  Occupational History  . Not on file  Tobacco Use  . Smoking status: Never Smoker  . Smokeless tobacco: Never Used  . Tobacco comment: grew up with a smoker  Substance and Sexual Activity  . Alcohol use: Yes    Alcohol/week: 1.0 standard drinks    Types: 1 Cans of beer per week  . Drug use: No  . Sexual activity: Not on file  Other Topics Concern  . Not on file  Social History Narrative  . Not on file   Social Determinants of Health   Financial Resource Strain:   . Difficulty of Paying Living Expenses:   Food Insecurity:   . Worried About Charity fundraiser in the Last Year:   . Arboriculturist in the Last Year:   Transportation Needs:   . Film/video editor (Medical):   Marland Kitchen Lack of Transportation (Non-Medical):   Physical Activity:   . Days of Exercise per Week:   . Minutes of Exercise per Session:   Stress:   . Feeling of  Stress :   Social Connections:   . Frequency of Communication with Friends and Family:   . Frequency of Social Gatherings with Friends and Family:   . Attends Religious Services:   . Active Member of Clubs or Organizations:   . Attends Archivist Meetings:   Marland Kitchen Marital Status:      Family History: The patient's family history includes Breast cancer in his sister; Diabetes in his mother; Heart attack in his father. There is no history of Anesthesia problems.  ROS:   Please see the history of present illness.     All other systems reviewed and are negative.  EKGs/Labs/Other Studies Reviewed:    The following studies were reviewed today:  Diagnostic Dominance: Right  Intervention    EKG:  EKG is not ordered today.    Recent Labs: 06/30/2019: Hemoglobin 15.1; Platelets 261 07/01/2019: BUN 11; Creatinine, Ser 0.79; Potassium 3.8; Sodium 136  Recent Lipid Panel    Component Value Date/Time   CHOL 199 06/30/2019 1252   TRIG 83 06/30/2019 1252   HDL 46 06/30/2019 1252   CHOLHDL 4.3 06/30/2019 1252   VLDL 17 06/30/2019 1252   LDLCALC 136 (H) 06/30/2019 1252    Physical Exam:    VS:  BP 110/76   Pulse 66   Ht 6' (1.829 m)   Wt 224 lb (101.6 kg)   SpO2 97%   BMI 30.38 kg/m     Wt Readings from Last 3 Encounters:  07/10/19 224 lb (101.6 kg)  07/01/19 234 lb 4.5 oz (106.3 kg)  07/11/18 244 lb 4.3 oz (110.8 kg)     GEN:  Well nourished, well developed in no acute distress HEENT: Normal NECK: No JVD; No carotid bruits LYMPHATICS: No lymphadenopathy CARDIAC: RRR, no murmurs, rubs, gallops RESPIRATORY:  Clear to auscultation without rales, wheezing or rhonchi  ABDOMEN: Soft, non-tender, non-distended MUSCULOSKELETAL:  No edema; No deformity  SKIN: Warm and dry NEUROLOGIC:  Alert and oriented x 3 PSYCHIATRIC:  Normal affect   ASSESSMENT:    1. Non-ST elevation (NSTEMI) myocardial infarction ([place])   2. Coronary artery disease due to lipid rich plaque    3. Essential hypertension   4. Diabetes mellitus with coincident hypertension (HCC)    PLAN:    In order of problems listed above:  Non-ST elevation myocardial infarction/coronary artery disease/new LAD stent 06/30/2019 -Troponin 2200 ST changes noted on ECG.  Carvedilol started low-dose with isosorbide.  EF 60 to 65% on echo. -Proximal RCA and apical LAD total occlusions with left to right collaterals supplying RCA.  Apical LAD was not well collateralized.  An 85% mid LAD stenosis was managed with drug-eluting stent. -If angina continues, we could consider CTO of RCA. --now feels great. Prior MI was pain in upperback angina. Mild pain with exercise.  Encouraged cardiac rehabilitation.  He knows to contact us if his anginal symptoms worsen.  We will have a low threshold for CTO clinic. -Okay to return to work.  Only has 3 more days then he is off for the summer.  Essential hypertension -Isosorbide was increased to 60  -On losartan HCTZ 100/25.  He did have a period of blood pressure where his blood pressure was in the 90s.  If this happens again, we could cut his pill in half.  Newly discovered type 2 diabetes -Hemoglobin A1c 10.5, has an appoint with [name] next week.  He has lost 10 pounds.  New start Metformin and Jardiance  Hyperlipidemia -LDL was 136 -Had been intolerant to simvastatin atorvastatin rosuvastatin.  He was willing to try atorvastatin again.  We placed him on 80 mg.  He is also scheduled to see the lipid clinic on 5/27 10:30 AM.  Right after this visit.  He seems to be tolerating the atorvastatin at this point.  Would repeat lipid panel in 3 months.  See him back in 3 months.    Medication Adjustments/Labs and Tests Ordered: Current medicines are reviewed at length with the patient today.  Concerns regarding medicines are outlined above.  No orders of the defined types were placed in this encounter.  No orders of the defined types were placed in this encounter.    Patient Instructions  Medication Instructions:   Your physician recommends that you continue on your  current medications as directed. Please refer to the Current Medication list given to you today.  *If you need a refill on your cardiac medications before your next appointment, please call your pharmacy*    Follow-Up:  3 MONTHS IN THE OFFICE WITH DR. Marlou Porch     Signed, Candee Furbish, MD  07/10/2019 10:28 AM    [place]

## 2019-07-10 NOTE — Patient Instructions (Signed)
Medication Instructions:   Your physician recommends that you continue on your current medications as directed. Please refer to the Current Medication list given to you today.  *If you need a refill on your cardiac medications before your next appointment, please call your pharmacy*    Follow-Up:  3 MONTHS IN THE OFFICE WITH DR. Marlou Porch

## 2019-07-10 NOTE — Patient Instructions (Addendum)
Continue atorvastatin 80mg  daily  Call us at [phone] with any questions or concerns  Continue to eat a low carb diet and increase your exercise slowly as tolerated  Fasting labs on 8/6 7:30-4:15

## 2019-07-10 NOTE — Progress Notes (Signed)
Patient ID: Zachary Hamilton                 DOB: [birth year]                    MRN: [id number]     HPI: Zachary Hamilton is a 59 y.o. male patient of Dr. Marlou Porch referred to lipid clinic by Cherlynn Polo, PA. PMH is significant for NSTEMI 06/30/19, new onset DM and HTN On last admission for NSTEMI LDL was 136. Per note, he is intolerant to rosuvastatin, atorvastatin, and simvastatinin the past, though willing to trial atorvastatin again.  Patient presents today to discuss medication management, including possible PCSK9i. He is a very pleasant and motivated gentleman. Patient states that so far he is doing well on the atorvastatin 17m. A year ago he walked a marathon, half marathon, 10K, 5K and 3K all within 3 weeks of each other. Currently he gets SOB walking for 10-12 min. He is trying to work his way back up to where he was slowly. He does all the cooking in his house so he has control over what he eats and how its prepared. He states that he always knew how to eat health, but has been doing more research lately. Use to do good for 4 days out of the week. Really trying to keep the "cheat days" to a minimum. He has cut back on his carbs a lot to help with new diagnosis of diabetes. He is going to aim for max 30 carbs per meal, 15 per snack. Does not drink much alcohol.  Current Medications: atorvastatin 859mIntolerances: simvastatin 40 (brain fog, muscle pains), rosuvastatin 45m61m times a week (muscle aches/brain fog), atorvastatin 59m12msucle aches, brain fog) Risk Factors: DM,  LDL goal: <55   Diet: cut back on carbs, lots of veggies, little fruit, nuts almonds, pistachios, 2 eggs per day, no bread, turk[place]icken, fish (salmon)  Exercise: walks 10-12 min per day- trying to build up- gets SOB after 10-12  Family History: The patient's family history includes Breast cancer in his sister; Diabetes in his mother; Heart attack in his father. There is no history of Anesthesia problems.  Social  History: never smoked, beer or two once a week  Labs:06/30/19 TC 199 TG 83 HDL 46 LDL 136  Past Medical History:  Diagnosis Date  . Chronic kidney disease   . Coronary artery disease    CTA 09/03/11 - 50-75% distal LAD, calcified prox LAD. NUC stress 09/05/11 - no flow limiting dz.   . Diabetes mellitus without complication (HCC)    NEW ONSET TYPE 2 PER PATIENT   . Dysphagia   . GERD (gastroesophageal reflux disease)   . Hypercholesteremia     Current Outpatient Medications on File Prior to Visit  Medication Sig Dispense Refill  . aspirin EC 81 MG tablet Take 81 mg by mouth daily.    . atMarland Kitchenrvastatin (LIPITOR) 80 MG tablet Take 1 tablet (80 mg total) by mouth daily. 30 tablet 6  . blood glucose meter kit and supplies Dispense based on patient and insurance preference. Use up to four times daily as directed. (FOR ICD-10 E10.9, E11.9). 1 each 0  . carvedilol (COREG) 3.125 MG tablet Take 1 tablet (3.125 mg total) by mouth 2 (two) times daily with a meal. 60 tablet 6  . Coenzyme Q10 300 MG CAPS Take 1 capsule by mouth daily.    . empagliflozin (JARDIANCE) 10 MG TABS tablet Take 10 mg  by mouth daily before breakfast. 30 tablet 6  . isosorbide mononitrate (IMDUR) 60 MG 24 hr tablet Take 1 tablet (60 mg total) by mouth daily. 90 tablet 3  . losartan-hydrochlorothiazide (HYZAAR) 100-25 MG tablet TAKE 1 TABLET BY MOUTH EVERY DAY (Patient taking differently: Take 1 tablet by mouth daily. ) 90 tablet 3  . metFORMIN (GLUCOPHAGE) 500 MG tablet Take 1 tablet (500 mg total) by mouth daily with breakfast. 30 tablet 11  . nitroGLYCERIN (NITROSTAT) 0.4 MG SL tablet Place 1 tablet (0.4 mg total) under the tongue every 5 (five) minutes x 3 doses as needed for chest pain. 25 tablet 3  . omeprazole (PRILOSEC) 20 MG capsule Take 20 mg by mouth daily.    . ticagrelor (BRILINTA) 90 MG TABS tablet Take 1 tablet (90 mg total) by mouth 2 (two) times daily. 180 tablet 3   No current facility-administered medications on  file prior to visit.    No Known Allergies  Assessment/Plan:  1. Hyperlipidemia - LDL is above goal of <55. We discussed different treatment options. We have decided to continue with atorvastatin 19m daily as he is currently tolerating fine. If he starts to have issues, we can decrease to 40 or 265mto see if that helps. We discussed PCSK9i cardiovascular risk reduction with statins, LDL lowering ability, side effects and cost. If patient is unable to tolerate high intensity statin (or if LDL is not at goal), then we will need to consider starting PCSK9i. Lipid, LFT and A1C scheduled for 8/6. Patient provided clinic number to call if he has any issues. His diet and exercise were discussed in detail and patient was encouraged to continue the efforts he has already set forth.   Thank you,  MeRamond DialPharm.D, BCPS, CPP CoNewton [id number]. [id number] [id number]: [phone]: [phone]

## 2019-07-11 ENCOUNTER — Telehealth (HOSPITAL_COMMUNITY): Payer: Self-pay

## 2019-07-11 ENCOUNTER — Encounter (HOSPITAL_COMMUNITY): Payer: Self-pay

## 2019-07-11 NOTE — Telephone Encounter (Signed)
Attempted to call patient in regards to Cardiac Rehab - LM on VM Mailed letter 

## 2019-07-19 ENCOUNTER — Other Ambulatory Visit: Payer: Self-pay | Admitting: Cardiology

## 2019-07-21 ENCOUNTER — Ambulatory Visit: Payer: BC Managed Care – PPO | Admitting: Cardiology

## 2019-07-29 ENCOUNTER — Telehealth (HOSPITAL_COMMUNITY): Payer: Self-pay

## 2019-07-29 NOTE — Telephone Encounter (Signed)
No response from pt regarding CR.  Closed referral.  

## 2019-09-19 ENCOUNTER — Other Ambulatory Visit: Payer: Self-pay

## 2019-09-19 ENCOUNTER — Other Ambulatory Visit: Payer: BC Managed Care – PPO | Admitting: *Deleted

## 2019-09-19 DIAGNOSIS — E119 Type 2 diabetes mellitus without complications: Secondary | ICD-10-CM

## 2019-09-19 DIAGNOSIS — E7800 Pure hypercholesterolemia, unspecified: Secondary | ICD-10-CM

## 2019-09-20 LAB — HEPATIC FUNCTION PANEL
ALT: 20 IU/L (ref 0–44)
AST: 18 IU/L (ref 0–40)
Albumin: 4.3 g/dL (ref 3.8–4.9)
Alkaline Phosphatase: 50 IU/L (ref 48–121)
Bilirubin Total: 0.5 mg/dL (ref 0.0–1.2)
Bilirubin, Direct: 0.14 mg/dL (ref 0.00–0.40)
Total Protein: 7.3 g/dL (ref 6.0–8.5)

## 2019-09-20 LAB — LIPID PANEL
Chol/HDL Ratio: 2.9 ratio (ref 0.0–5.0)
Cholesterol, Total: 141 mg/dL (ref 100–199)
HDL: 48 mg/dL (ref >39)
LDL Chol Calc (NIH): 72 mg/dL (ref 0–99)
Triglycerides: 116 mg/dL (ref 0–149)
VLDL Cholesterol Cal: 21 mg/dL (ref 5–40)

## 2019-09-20 LAB — HEMOGLOBIN A1C
Est. average glucose Bld gHb Est-mCnc: 146 mg/dL
Hgb A1c MFr Bld: 6.7 % — ABNORMAL HIGH (ref 4.8–5.6)

## 2019-09-22 ENCOUNTER — Telehealth: Payer: Self-pay | Admitting: Pharmacist

## 2019-09-22 NOTE — Telephone Encounter (Signed)
Significant improvement in A1C and LDL. LDL is down to 72 on atorvastatin 80mg . I do think that the best option would be to treat even more aggressively down to <55. This would correlate with the AACE/ACE guidelines and the european guidelines. Might consider adding zetia which should get LDL very close to <55. PCSK9i is an option, but zetia may be more cost effective in this situation. Patient should be congratulated on his significant diet improvement and getting A1C down to 6.7.  Left message for patient to call back to discuss

## 2019-09-23 NOTE — Telephone Encounter (Signed)
Lets add Zetia 10 mg once a day to his regimen.  Continue with atorvastatin 80 mg.  Excellent overall reduction.  Repeat lipid panel in 3 months.  Candee Furbish, MD

## 2019-09-24 ENCOUNTER — Other Ambulatory Visit: Payer: Self-pay | Admitting: Cardiology

## 2019-09-25 MED ORDER — EZETIMIBE 10 MG PO TABS
10.0000 mg | ORAL_TABLET | Freq: Every day | ORAL | 3 refills | Status: DC
Start: 2019-09-25 — End: 2019-10-23

## 2019-09-25 NOTE — Telephone Encounter (Signed)
Spoke with patient. He is in agreement to start zetia 10mg  daily. Continue atorvastatin 80mg  daily. Will recheck labs in about 3 months.

## 2019-10-23 ENCOUNTER — Other Ambulatory Visit: Payer: Self-pay

## 2019-10-23 ENCOUNTER — Ambulatory Visit (INDEPENDENT_AMBULATORY_CARE_PROVIDER_SITE_OTHER): Payer: BC Managed Care – PPO | Admitting: Cardiology

## 2019-10-23 ENCOUNTER — Encounter: Payer: Self-pay | Admitting: Cardiology

## 2019-10-23 VITALS — BP 120/70 | HR 61 | Ht 72.0 in | Wt 211.0 lb

## 2019-10-23 DIAGNOSIS — I10 Essential (primary) hypertension: Secondary | ICD-10-CM

## 2019-10-23 DIAGNOSIS — I2510 Atherosclerotic heart disease of native coronary artery without angina pectoris: Secondary | ICD-10-CM

## 2019-10-23 DIAGNOSIS — I2583 Coronary atherosclerosis due to lipid rich plaque: Secondary | ICD-10-CM

## 2019-10-23 MED ORDER — EZETIMIBE 10 MG PO TABS: 10.0000 mg | ORAL_TABLET | Freq: Every day | ORAL | 3 refills | Status: DC

## 2019-10-23 MED ORDER — ISOSORBIDE MONONITRATE ER 60 MG PO TB24: 60.0000 mg | ORAL_TABLET | Freq: Every day | ORAL | 3 refills | Status: DC

## 2019-10-23 NOTE — Progress Notes (Signed)
Cardiology Office Note:    Date:  10/24/2019   ID:  Zachary Hamilton, DOB 02-27-1960, MRN [id number]  PCP:  Lawerance Cruel, MD  [facility] Cardiologist:  Candee Furbish, MD  [facility] Electrophysiologist:  None   Referring MD: Lawerance Cruel, MD   History of Present Illness:    Zachary Hamilton is a 59 y.o. male with coronary artery disease, prior proximal total occlusion of RCA and apical LAD.  The RCA fills by collaterals from septal perforators of LAD. -Stent was placed to the mid LAD on 06/30/2019.  He was placed on Brilinta and aspirin. CTO of the RCA was left alone.  Isosorbide, carvedilol used for antianginal support.  Doing very well.  No chest pain.  No bleeding.  Occasional nuisance bruising.  Past Medical History:  Diagnosis Date  . Chronic kidney disease   . Coronary artery disease    CTA 09/03/11 - 50-75% distal LAD, calcified prox LAD. NUC stress 09/05/11 - no flow limiting dz.   . Diabetes mellitus without complication (HCC)    NEW ONSET TYPE 2 PER PATIENT   . Dysphagia   . GERD (gastroesophageal reflux disease)   . Hypercholesteremia     Past Surgical History:  Procedure Laterality Date  . CARDIAC CATHETERIZATION  06/30/2019  . CORONARY STENT INTERVENTION N/A 06/30/2019   Procedure: CORONARY STENT INTERVENTION;  Surgeon: Belva Crome, MD;  Location: [place] [facility];  Service: Cardiovascular;  Laterality: N/A;  . ESOPHAGOGASTRODUODENOSCOPY  02/10/2011   Procedure: ESOPHAGOGASTRODUODENOSCOPY (EGD);  Surgeon: Jeryl Columbia, MD;  Location: [facility];  Service: Endoscopy;  Laterality: N/A;  . EXCISION METACARPAL MASS Right 07/11/2018   Procedure: EXCISION CYST, DEBRIDEMENT DISTAL INTERPHALANGEAL JOINT RIGHT MIDDLE FINGER;  Surgeon: Daryll Brod, MD;  Location: [place];  Service: Orthopedics;  Laterality: Right;  . LEFT HEART CATH AND CORONARY ANGIOGRAPHY N/A 06/30/2019   Procedure: LEFT HEART CATH AND CORONARY ANGIOGRAPHY;   Surgeon: Belva Crome, MD;  Location: [place] [facility];  Service: Cardiovascular;  Laterality: N/A;  . LEFT HEART CATHETERIZATION WITH CORONARY ANGIOGRAM Bilateral 08/12/2012   Procedure: LEFT HEART CATHETERIZATION WITH CORONARY ANGIOGRAM;  Surgeon: Candee Furbish, MD;  Location: [facility];  Service: Cardiovascular;  Laterality: Bilateral;  . NASAL SINUS SURGERY  2000  . SAVORY DILATION  02/10/2011   Procedure: SAVORY DILATION;  Surgeon: Jeryl Columbia, MD;  Location: [facility];  Service: Endoscopy;  Laterality: N/A;    Current Medications: Current Meds  Medication Sig  . Accu-Chek Softclix Lancets lancets   . aspirin EC 81 MG tablet Take 81 mg by mouth daily.  Marland Kitchen atorvastatin (LIPITOR) 80 MG tablet Take 1 tablet (80 mg total) by mouth daily.  . blood glucose meter kit and supplies Dispense based on patient and insurance preference. Use up to four times daily as directed. (FOR ICD-10 E10.9, E11.9).  . carvedilol (COREG) 3.125 MG tablet Take 1 tablet (3.125 mg total) by mouth 2 (two) times daily with a meal.  . Coenzyme Q10 300 MG CAPS Take 1 capsule by mouth daily.  . empagliflozin (JARDIANCE) 10 MG TABS tablet Take 10 mg by mouth daily before breakfast. (Patient taking differently: Take 25 mg by mouth daily before breakfast. )  . ezetimibe (ZETIA) 10 MG tablet Take 1 tablet (10 mg total) by mouth daily.  . isosorbide mononitrate (IMDUR) 60 MG 24 hr tablet Take 1 tablet (60 mg total) by mouth daily.  Marland Kitchen losartan-hydrochlorothiazide (HYZAAR) 100-25 MG  tablet TAKE 1 TABLET BY MOUTH EVERY DAY  . nitroGLYCERIN (NITROSTAT) 0.4 MG SL tablet Place 1 tablet (0.4 mg total) under the tongue every 5 (five) minutes x 3 doses as needed for chest pain.  Marland Kitchen omeprazole (PRILOSEC) 20 MG capsule Take 20 mg by mouth daily.  . ticagrelor (BRILINTA) 90 MG TABS tablet Take 1 tablet (90 mg total) by mouth 2 (two) times daily.  . [DISCONTINUED] ezetimibe (ZETIA) 10 MG tablet Take 1 tablet (10 mg total) by mouth  daily.  . [DISCONTINUED] isosorbide mononitrate (IMDUR) 60 MG 24 hr tablet Take 1 tablet (60 mg total) by mouth daily.     Allergies:   Patient has no known allergies.   Social History   Socioeconomic History  . Marital status: Married    Spouse name: Not on file  . Number of children: Not on file  . Years of education: Not on file  . Highest education level: Not on file  Occupational History  . Not on file  Tobacco Use  . Smoking status: Never Smoker  . Smokeless tobacco: Never Used  . Tobacco comment: grew up with a smoker  Vaping Use  . Vaping Use: Never used  Substance and Sexual Activity  . Alcohol use: Yes    Alcohol/week: 1.0 standard drink    Types: 1 Cans of beer per week  . Drug use: No  . Sexual activity: Not on file  Other Topics Concern  . Not on file  Social History Narrative  . Not on file   Social Determinants of Health   Financial Resource Strain:   . Difficulty of Paying Living Expenses: Not on file  Food Insecurity:   . Worried About Charity fundraiser in the Last Year: Not on file  . Ran Out of Food in the Last Year: Not on file  Transportation Needs:   . Lack of Transportation (Medical): Not on file  . Lack of Transportation (Non-Medical): Not on file  Physical Activity:   . Days of Exercise per Week: Not on file  . Minutes of Exercise per Session: Not on file  Stress:   . Feeling of Stress : Not on file  Social Connections:   . Frequency of Communication with Friends and Family: Not on file  . Frequency of Social Gatherings with Friends and Family: Not on file  . Attends Religious Services: Not on file  . Active Member of Clubs or Organizations: Not on file  . Attends Archivist Meetings: Not on file  . Marital Status: Not on file     Family History: The patient's family history includes Breast cancer in his sister; Diabetes in his mother; Heart attack in his father. There is no history of Anesthesia problems.  ROS:    Please see the history of present illness.     All other systems reviewed and are negative.  EKGs/Labs/Other Studies Reviewed:      EKG:  EKG is  ordered today.  The ekg ordered today demonstrates sinus rhythm 61 with no other abnormalities.  PR interval is 214 ms.  Recent Labs: 06/30/2019: Hemoglobin 15.1; Platelets 261 07/01/2019: BUN 11; Creatinine, Ser 0.79; Potassium 3.8; Sodium 136 09/19/2019: ALT 20  Recent Lipid Panel    Component Value Date/Time   CHOL 141 09/19/2019 0750   TRIG 116 09/19/2019 0750   HDL 48 09/19/2019 0750   CHOLHDL 2.9 09/19/2019 0750   CHOLHDL 4.3 06/30/2019 1252   VLDL 17 06/30/2019 1252   LDLCALC  72 09/19/2019 0750    Physical Exam:    VS:  BP 120/70   Pulse 61   Ht 6' (1.829 m)   Wt 211 lb (95.7 kg)   SpO2 98%   BMI 28.62 kg/m     Wt Readings from Last 3 Encounters:  10/23/19 211 lb (95.7 kg)  07/10/19 224 lb (101.6 kg)  07/01/19 234 lb 4.5 oz (106.3 kg)     GEN:  Well nourished, well developed in no acute distress HEENT: Normal NECK: No JVD; No carotid bruits LYMPHATICS: No lymphadenopathy CARDIAC: RRR, no murmurs, rubs, gallops RESPIRATORY:  Clear to auscultation without rales, wheezing or rhonchi  ABDOMEN: Soft, non-tender, non-distended MUSCULOSKELETAL:  No edema; No deformity  SKIN: Warm and dry NEUROLOGIC:  Alert and oriented x 3 PSYCHIATRIC:  Normal affect   ASSESSMENT:    1. Essential hypertension   2. Coronary artery disease due to lipid rich plaque    PLAN:    In order of problems listed above:  Coronary artery disease -Continue with aggressive secondary risk factor prevention.  No anginal symptoms.  Doing well.  At this time, no indication for CTO therapy. -In May, we will likely discontinue his Brilinta and continue with aspirin monotherapy at that point.  Make a decision at that time.  He is having some nuisance bruising but no major bleeding.  Hyperlipidemia -Last LDL 72 on both Zetia 10 and atorvastatin 80.   Doing well.  Close to goal of less than 70.  Continue with diet and exercise.  He has lost close to 40 pounds over the last several months.  Working with his diabetes as well.  Diabetes with hypertension -Hemoglobin A1c is now 6.7, much improved from 10.  [name] has been managing. Off of metformin and on Jardiance.   Angina -Continue with isosorbide, well controlled.  Also on low-dose carvedilol as well.   Medication Adjustments/Labs and Tests Ordered: Current medicines are reviewed at length with the patient today.  Concerns regarding medicines are outlined above.  Orders Placed This Encounter  Procedures  . EKG 12-Lead   Meds ordered this encounter  Medications  . ezetimibe (ZETIA) 10 MG tablet    Sig: Take 1 tablet (10 mg total) by mouth daily.    Dispense:  90 tablet    Refill:  3  . isosorbide mononitrate (IMDUR) 60 MG 24 hr tablet    Sig: Take 1 tablet (60 mg total) by mouth daily.    Dispense:  90 tablet    Refill:  3    Patient Instructions  Medication Instructions:  The current medical regimen is effective;  continue present plan and medications.  *If you need a refill on your cardiac medications before your next appointment, please call your pharmacy*  Follow-Up: At [facility], you and your health needs are our priority.  As part of our continuing mission to provide you with exceptional heart care, we have created designated Provider Care Teams.  These Care Teams include your primary Cardiologist (physician) and Advanced Practice Providers (APPs -  Physician Assistants and Nurse Practitioners) who all work together to provide you with the care you need, when you need it.  We recommend signing up for the patient portal called "MyChart".  Sign up information is provided on this After Visit Summary.  MyChart is used to connect with patients for Virtual Visits (Telemedicine).  Patients are able to view lab/test results, encounter notes, upcoming appointments, etc.   Non-urgent messages can be sent to  your provider as well.   To learn more about what you can do with MyChart, go to [email or web address].    Your next appointment:   6 month(s)  The format for your next appointment:   In Person  Provider:   Candee Furbish, MD  Thank you for choosing [facility]!!        Signed, Candee Furbish, MD  10/24/2019 6:17 AM    [place]

## 2019-10-23 NOTE — Patient Instructions (Signed)

## 2019-10-29 ENCOUNTER — Other Ambulatory Visit: Payer: Self-pay | Admitting: Cardiology

## 2020-02-05 ENCOUNTER — Other Ambulatory Visit: Payer: Self-pay | Admitting: Physician Assistant

## 2020-02-05 DIAGNOSIS — R160 Hepatomegaly, not elsewhere classified: Secondary | ICD-10-CM

## 2020-02-26 ENCOUNTER — Telehealth: Payer: Self-pay

## 2020-02-26 DIAGNOSIS — E119 Type 2 diabetes mellitus without complications: Secondary | ICD-10-CM

## 2020-02-26 DIAGNOSIS — E7800 Pure hypercholesterolemia, unspecified: Secondary | ICD-10-CM

## 2020-02-26 NOTE — Telephone Encounter (Signed)
-----   Message from Ramond Dial, [place] sent at 02/26/2020 12:34 PM EST -----  ----- Message ----- From: Ramond Dial, RPH-CPP Sent: 02/26/2020  12:00 AM EST To: Ramond Dial, RPH-CPP  Set up lipid and A1C labs (if A1C is needed) ----- Message ----- From: Ramond Dial, RPH-CPP Sent: 12/04/2019 To: Ramond Dial, RPH-CPP  Did he set up lab apt?

## 2020-02-26 NOTE — Telephone Encounter (Signed)
Called and lmomed pt to call us back to schedule lipid and a1c that I ordered

## 2020-04-27 ENCOUNTER — Ambulatory Visit: Payer: BC Managed Care – PPO | Admitting: Cardiology

## 2020-04-27 ENCOUNTER — Encounter: Payer: Self-pay | Admitting: Cardiology

## 2020-04-27 ENCOUNTER — Other Ambulatory Visit: Payer: Self-pay

## 2020-04-27 VITALS — BP 114/70 | HR 62 | Ht 72.0 in | Wt 218.0 lb

## 2020-04-27 DIAGNOSIS — E119 Type 2 diabetes mellitus without complications: Secondary | ICD-10-CM

## 2020-04-27 DIAGNOSIS — I2510 Atherosclerotic heart disease of native coronary artery without angina pectoris: Secondary | ICD-10-CM | POA: Diagnosis not present

## 2020-04-27 DIAGNOSIS — I2583 Coronary atherosclerosis due to lipid rich plaque: Secondary | ICD-10-CM | POA: Diagnosis not present

## 2020-04-27 NOTE — Patient Instructions (Addendum)
Medication Instructions:  Please continue your current medications. You may discontinue your Brilinta after Jun 29, 2020.  *If you need a refill on your cardiac medications before your next appointment, please call your pharmacy*  Follow-Up: At [facility], you and your health needs are our priority.  As part of our continuing mission to provide you with exceptional heart care, we have created designated Provider Care Teams.  These Care Teams include your primary Cardiologist (physician) and Advanced Practice Providers (APPs -  Physician Assistants and Nurse Practitioners) who all work together to provide you with the care you need, when you need it.  We recommend signing up for the patient portal called "MyChart".  Sign up information is provided on this After Visit Summary.  MyChart is used to connect with patients for Virtual Visits (Telemedicine).  Patients are able to view lab/test results, encounter notes, upcoming appointments, etc.  Non-urgent messages can be sent to your provider as well.   To learn more about what you can do with MyChart, go to [email or web address].    Your next appointment:   6 month(s)  The format for your next appointment:   In Person  Provider:   Candee Furbish, MD  Thank you for choosing [facility]!!

## 2020-04-27 NOTE — Progress Notes (Signed)
Cardiology Office Note:    Date:  04/27/2020   ID:  Zachary Hamilton, DOB 1960/04/06, MRN [id number]  PCP:  Lawerance Cruel, [place]  Cardiologist:  Candee Furbish, MD  Advanced Practice Provider:  No care team member to display Electrophysiologist:  None       Referring MD: Lawerance Cruel, MD    History of Present Illness:    Zachary Hamilton is a 60 y.o. male here for the evaluation of coronary artery disease.  CTO of RCA filled via collaterals.  Mid LAD stent placed 06/30/2019.    Doing well.  No fevers chills nausea vomiting syncope bleeding.  Uses both isosorbide and carvedilol.  Taking the middle school to beach for camping.  Broke his left thumb playing catch with a football at school.  Past Medical History:  Diagnosis Date  . Chronic kidney disease   . Coronary artery disease    CTA 09/03/11 - 50-75% distal LAD, calcified prox LAD. NUC stress 09/05/11 - no flow limiting dz.   . Diabetes mellitus without complication (HCC)    NEW ONSET TYPE 2 PER PATIENT   . Dysphagia   . GERD (gastroesophageal reflux disease)   . Hypercholesteremia     Past Surgical History:  Procedure Laterality Date  . CARDIAC CATHETERIZATION  06/30/2019  . CORONARY STENT INTERVENTION N/A 06/30/2019   Procedure: CORONARY STENT INTERVENTION;  Surgeon: Belva Crome, MD;  Location: [place] [facility];  Service: Cardiovascular;  Laterality: N/A;  . ESOPHAGOGASTRODUODENOSCOPY  02/10/2011   Procedure: ESOPHAGOGASTRODUODENOSCOPY (EGD);  Surgeon: Jeryl Columbia, MD;  Location: [facility];  Service: Endoscopy;  Laterality: N/A;  . EXCISION METACARPAL MASS Right 07/11/2018   Procedure: EXCISION CYST, DEBRIDEMENT DISTAL INTERPHALANGEAL JOINT RIGHT MIDDLE FINGER;  Surgeon: Daryll Brod, MD;  Location: [place];  Service: Orthopedics;  Laterality: Right;  . LEFT HEART CATH AND CORONARY ANGIOGRAPHY N/A 06/30/2019   Procedure: LEFT HEART CATH AND CORONARY  ANGIOGRAPHY;  Surgeon: Belva Crome, MD;  Location: [place] [facility];  Service: Cardiovascular;  Laterality: N/A;  . LEFT HEART CATHETERIZATION WITH CORONARY ANGIOGRAM Bilateral 08/12/2012   Procedure: LEFT HEART CATHETERIZATION WITH CORONARY ANGIOGRAM;  Surgeon: Candee Furbish, MD;  Location: [facility] CATH LAB;  Service: Cardiovascular;  Laterality: Bilateral;  . NASAL SINUS SURGERY  2000  . SAVORY DILATION  02/10/2011   Procedure: SAVORY DILATION;  Surgeon: Jeryl Columbia, MD;  Location: [facility];  Service: Endoscopy;  Laterality: N/A;    Current Medications: Current Meds  Medication Sig  . Accu-Chek Softclix Lancets lancets   . aspirin EC 81 MG tablet Take 81 mg by mouth daily.  Marland Kitchen atorvastatin (LIPITOR) 80 MG tablet Take 1 tablet (80 mg total) by mouth daily.  . blood glucose meter kit and supplies Dispense based on patient and insurance preference. Use up to four times daily as directed. (FOR ICD-10 E10.9, E11.9).  . carvedilol (COREG) 3.125 MG tablet Take 1 tablet (3.125 mg total) by mouth 2 (two) times daily with a meal.  . Coenzyme Q10 300 MG CAPS Take 1 capsule by mouth daily.  . empagliflozin (JARDIANCE) 10 MG TABS tablet Take 10 mg by mouth daily before breakfast. (Patient taking differently: Take 25 mg by mouth daily before breakfast.)  . ezetimibe (ZETIA) 10 MG tablet Take 1 tablet (10 mg total) by mouth daily.  . isosorbide mononitrate (IMDUR) 60 MG 24 hr tablet Take 1 tablet (60 mg total) by  mouth daily.  Marland Kitchen losartan-hydrochlorothiazide (HYZAAR) 100-25 MG tablet TAKE 1 TABLET BY MOUTH EVERY DAY  . nitroGLYCERIN (NITROSTAT) 0.4 MG SL tablet Place 1 tablet (0.4 mg total) under the tongue every 5 (five) minutes x 3 doses as needed for chest pain.  Marland Kitchen omeprazole (PRILOSEC) 20 MG capsule Take 20 mg by mouth daily.  . ticagrelor (BRILINTA) 90 MG TABS tablet Take 1 tablet (90 mg total) by mouth 2 (two) times daily.     Allergies:   Patient has no known allergies.   Social History    Socioeconomic History  . Marital status: Married    Spouse name: Not on file  . Number of children: Not on file  . Years of education: Not on file  . Highest education level: Not on file  Occupational History  . Not on file  Tobacco Use  . Smoking status: Never Smoker  . Smokeless tobacco: Never Used  . Tobacco comment: grew up with a smoker  Vaping Use  . Vaping Use: Never used  Substance and Sexual Activity  . Alcohol use: Yes    Alcohol/week: 1.0 standard drink    Types: 1 Cans of beer per week  . Drug use: No  . Sexual activity: Not on file  Other Topics Concern  . Not on file  Social History Narrative  . Not on file   Social Determinants of Health   Financial Resource Strain: Not on file  Food Insecurity: Not on file  Transportation Needs: Not on file  Physical Activity: Not on file  Stress: Not on file  Social Connections: Not on file     Family History: The patient's family history includes Breast cancer in his sister; Diabetes in his mother; Heart attack in his father. There is no history of Anesthesia problems.  ROS:   Please see the history of present illness.     All other systems reviewed and are negative.  EKGs/Labs/Other Studies Reviewed:    Recent Labs: 06/30/2019: Hemoglobin 15.1; Platelets 261 07/01/2019: BUN 11; Creatinine, Ser 0.79; Potassium 3.8; Sodium 136 09/19/2019: ALT 20  Recent Lipid Panel    Component Value Date/Time   CHOL 141 09/19/2019 0750   TRIG 116 09/19/2019 0750   HDL 48 09/19/2019 0750   CHOLHDL 2.9 09/19/2019 0750   CHOLHDL 4.3 06/30/2019 1252   VLDL 17 06/30/2019 1252   LDLCALC 72 09/19/2019 0750     Risk Assessment/Calculations:      Physical Exam:    VS:  BP 114/70 (BP Location: Left Arm, Patient Position: Sitting, Cuff Size: Normal)   Pulse 62   Ht 6' (1.829 m)   Wt 218 lb (98.9 kg)   SpO2 98%   BMI 29.57 kg/m     Wt Readings from Last 3 Encounters:  04/27/20 218 lb (98.9 kg)  10/23/19 211 lb (95.7  kg)  07/10/19 224 lb (101.6 kg)     GEN:  Well nourished, well developed in no acute distress HEENT: Normal NECK: No JVD; No carotid bruits LYMPHATICS: No lymphadenopathy CARDIAC: RRR, no murmurs, rubs, gallops RESPIRATORY:  Clear to auscultation without rales, wheezing or rhonchi  ABDOMEN: Soft, non-tender, non-distended MUSCULOSKELETAL:  No edema; No deformity  SKIN: Warm and dry NEUROLOGIC:  Alert and oriented x 3 PSYCHIATRIC:  Normal affect   ASSESSMENT:    1. Coronary artery disease due to lipid rich plaque   2. DM type 2, goal HbA1c < 7% (HCC)    PLAN:    In order of problems  listed above:  Coronary artery disease -LAD stent 06/2019 -In May discontinue Brilinta and continue with aspirin monotherapy. -Doing very well.  No anginal symptoms.  Hyperlipidemia -On Zetia and atorvastatin 80.  Last LDL 72.  Excellent job with weight loss.  40 pounds over the last several months.  Has gained a little bit back over the winter.  Working on this.  Diabetes with hypertension -Hemoglobin A1c 7.3.  Off of Metformin.  He is back on Jardiance 10.  May need Metformin once again.  [name] managing.  Doing well with diet       Medication Adjustments/Labs and Tests Ordered: Current medicines are reviewed at length with the patient today.  Concerns regarding medicines are outlined above.  No orders of the defined types were placed in this encounter.  No orders of the defined types were placed in this encounter.   Patient Instructions  Medication Instructions:  Please continue your current medications. You may discontinue your Brilinta after Jun 29, 2020.  *If you need a refill on your cardiac medications before your next appointment, please call your pharmacy*  Follow-Up: At [facility], you and your health needs are our priority.  As part of our continuing mission to provide you with exceptional heart care, we have created designated Provider Care Teams.  These Care Teams  include your primary Cardiologist (physician) and Advanced Practice Providers (APPs -  Physician Assistants and Nurse Practitioners) who all work together to provide you with the care you need, when you need it.  We recommend signing up for the patient portal called "MyChart".  Sign up information is provided on this After Visit Summary.  MyChart is used to connect with patients for Virtual Visits (Telemedicine).  Patients are able to view lab/test results, encounter notes, upcoming appointments, etc.  Non-urgent messages can be sent to your provider as well.   To learn more about what you can do with MyChart, go to [email or web address].    Your next appointment:   6 month(s)  The format for your next appointment:   In Person  Provider:   Candee Furbish, MD  Thank you for choosing [facility]!!        Signed, Candee Furbish, MD  04/27/2020 4:41 PM    [place]

## 2020-10-09 ENCOUNTER — Other Ambulatory Visit: Payer: Self-pay | Admitting: Cardiology

## 2020-11-12 ENCOUNTER — Encounter: Payer: Self-pay | Admitting: Cardiology

## 2020-11-12 ENCOUNTER — Ambulatory Visit (INDEPENDENT_AMBULATORY_CARE_PROVIDER_SITE_OTHER): Payer: BC Managed Care – PPO | Admitting: Cardiology

## 2020-11-12 ENCOUNTER — Other Ambulatory Visit: Payer: Self-pay

## 2020-11-12 VITALS — BP 130/70 | HR 67 | Ht 72.0 in | Wt 210.0 lb

## 2020-11-12 DIAGNOSIS — I2510 Atherosclerotic heart disease of native coronary artery without angina pectoris: Secondary | ICD-10-CM

## 2020-11-12 DIAGNOSIS — E119 Type 2 diabetes mellitus without complications: Secondary | ICD-10-CM

## 2020-11-12 DIAGNOSIS — E7800 Pure hypercholesterolemia, unspecified: Secondary | ICD-10-CM | POA: Diagnosis not present

## 2020-11-12 DIAGNOSIS — I10 Essential (primary) hypertension: Secondary | ICD-10-CM | POA: Diagnosis not present

## 2020-11-12 DIAGNOSIS — I2583 Coronary atherosclerosis due to lipid rich plaque: Secondary | ICD-10-CM | POA: Diagnosis not present

## 2020-11-12 NOTE — Assessment & Plan Note (Signed)
Excellent control.  No changes made.  Continue with current medication management.

## 2020-11-12 NOTE — Assessment & Plan Note (Signed)
Continuing with both Zetia 10 mg as well as atorvastatin 80 mg.  Last LDL 40.  Excellent.  No myalgias.

## 2020-11-12 NOTE — Assessment & Plan Note (Signed)
LAD stent was placed in May 2021.  He is now off of the Brilinta.  He is on aspirin monotherapy.  No anginal symptoms.  Continue with isosorbide, carvedilol dosing reviewed as above.

## 2020-11-12 NOTE — Assessment & Plan Note (Signed)
Great job with hemoglobin A1c of 6.3.  8 pound weight loss.  Decrease carbohydrates.  Wonderful.

## 2020-11-12 NOTE — Patient Instructions (Signed)
Medication Instructions:  The current medical regimen is effective;  continue present plan and medications.  *If you need a refill on your cardiac medications before your next appointment, please call your pharmacy*  Follow-Up: At [facility], you and your health needs are our priority.  As part of our continuing mission to provide you with exceptional heart care, we have created designated Provider Care Teams.  These Care Teams include your primary Cardiologist (physician) and Advanced Practice Providers (APPs -  Physician Assistants and Nurse Practitioners) who all work together to provide you with the care you need, when you need it.  We recommend signing up for the patient portal called "MyChart".  Sign up information is provided on this After Visit Summary.  MyChart is used to connect with patients for Virtual Visits (Telemedicine).  Patients are able to view lab/test results, encounter notes, upcoming appointments, etc.  Non-urgent messages can be sent to your provider as well.   To learn more about what you can do with MyChart, go to [email or web address].    Your next appointment:   1 year(s)  The format for your next appointment:   In Person  Provider:   Mark Skains, MD   Thank you for choosing [facility]!!    

## 2020-11-12 NOTE — Progress Notes (Signed)
Cardiology Office Note:    Date:  11/12/2020   ID:  Zachary Hamilton, DOB [birth year], MRN [id number]  PCP:  Lawerance Cruel, [place]  Cardiologist:  Candee Furbish, MD  Advanced Practice Provider:  No care team member to display Electrophysiologist:  None       Referring MD: Lawerance Cruel, MD    History of Present Illness:    Zachary Hamilton is a 60 y.o. male here for the follow-up of coronary artery disease, hypertension, and hyperlipidemia.  CTO of RCA filled via collaterals.  Mid LAD stent placed 06/30/2019.    Uses both isosorbide and carvedilol.  Taking the middle school to beach for camping.  Broke his left thumb playing catch with a football at school.  Today: Overall he is feeling well. He has been tolerating his medications well.  Since his last visit he has successfully lost 8 lbs. Lately he has been monitoring his carb intake, and avoiding processed foods.  His mother passed away last year, and his family is considering getting memorial tattoos. He asks if any medications would cause issues with this.  He denies any palpitations, chest pain, or shortness of breath. No lightheadedness, headaches, syncope, orthopnea, or PND. Also has no lower extremity edema or exertional symptoms.   Past Medical History:  Diagnosis Date   Chronic kidney disease    Coronary artery disease    CTA 09/03/11 - 50-75% distal LAD, calcified prox LAD. NUC stress 09/05/11 - no flow limiting dz.    Diabetes mellitus without complication (HCC)    NEW ONSET TYPE 2 PER PATIENT    Dysphagia    GERD (gastroesophageal reflux disease)    Hypercholesteremia     Past Surgical History:  Procedure Laterality Date   CARDIAC CATHETERIZATION  06/30/2019   CORONARY STENT INTERVENTION N/A 06/30/2019   Procedure: CORONARY STENT INTERVENTION;  Surgeon: Belva Crome, MD;  Location: [facility];  Service: Cardiovascular;  Laterality: N/A;    ESOPHAGOGASTRODUODENOSCOPY  02/10/2011   Procedure: ESOPHAGOGASTRODUODENOSCOPY (EGD);  Surgeon: Jeryl Columbia, MD;  Location: [facility];  Service: Endoscopy;  Laterality: N/A;   EXCISION METACARPAL MASS Right 07/11/2018   Procedure: EXCISION CYST, DEBRIDEMENT DISTAL INTERPHALANGEAL JOINT RIGHT MIDDLE FINGER;  Surgeon: Daryll Brod, MD;  Location: [name];  Service: Orthopedics;  Laterality: Right;   LEFT HEART CATH AND CORONARY ANGIOGRAPHY N/A 06/30/2019   Procedure: LEFT HEART CATH AND CORONARY ANGIOGRAPHY;  Surgeon: Belva Crome, MD;  Location: [facility];  Service: Cardiovascular;  Laterality: N/A;   LEFT HEART CATHETERIZATION WITH CORONARY ANGIOGRAM Bilateral 08/12/2012   Procedure: LEFT HEART CATHETERIZATION WITH CORONARY ANGIOGRAM;  Surgeon: Candee Furbish, MD;  Location: [facility];  Service: Cardiovascular;  Laterality: Bilateral;   NASAL SINUS SURGERY  2000   SAVORY DILATION  02/10/2011   Procedure: SAVORY DILATION;  Surgeon: Jeryl Columbia, MD;  Location: [facility];  Service: Endoscopy;  Laterality: N/A;    Current Medications: Current Meds  Medication Sig   Accu-Chek Softclix Lancets lancets    aspirin EC 81 MG tablet Take 81 mg by mouth daily.   atorvastatin (LIPITOR) 80 MG tablet Take 1 tablet (80 mg total) by mouth daily.   blood glucose meter kit and supplies Dispense based on patient and insurance preference. Use up to four times daily as directed. (FOR ICD-10 E10.9, E11.9).   carvedilol (COREG) 3.125 MG tablet Take 1 tablet (3.125 mg total)  by mouth 2 (two) times daily with a meal.   Coenzyme Q10 300 MG CAPS Take 1 capsule by mouth daily.   empagliflozin (JARDIANCE) 10 MG TABS tablet Take 10 mg by mouth daily before breakfast. (Patient taking differently: Take 25 mg by mouth daily before breakfast.)   ezetimibe (ZETIA) 10 MG tablet Take 1 tablet (10 mg total) by mouth daily.   isosorbide mononitrate (IMDUR) 60 MG 24 hr tablet TAKE 1 TABLET BY MOUTH  EVERY DAY   losartan-hydrochlorothiazide (HYZAAR) 100-25 MG tablet TAKE 1 TABLET BY MOUTH EVERY DAY   nitroGLYCERIN (NITROSTAT) 0.4 MG SL tablet Place 1 tablet (0.4 mg total) under the tongue every 5 (five) minutes x 3 doses as needed for chest pain.   omeprazole (PRILOSEC) 20 MG capsule Take 20 mg by mouth daily.     Allergies:   Patient has no known allergies.   Social History   Socioeconomic History   Marital status: Married    Spouse name: Not on file   Number of children: Not on file   Years of education: Not on file   Highest education level: Not on file  Occupational History   Not on file  Tobacco Use   Smoking status: Never   Smokeless tobacco: Never   Tobacco comments:    grew up with a smoker  Vaping Use   Vaping Use: Never used  Substance and Sexual Activity   Alcohol use: Yes    Alcohol/week: 1.0 standard drink    Types: 1 Cans of beer per week   Drug use: No   Sexual activity: Not on file  Other Topics Concern   Not on file  Social History Narrative   Not on file   Social Determinants of Health   Financial Resource Strain: Not on file  Food Insecurity: Not on file  Transportation Needs: Not on file  Physical Activity: Not on file  Stress: Not on file  Social Connections: Not on file     Family History: The patient's family history includes Breast cancer in his sister; Diabetes in his mother; Heart attack in his father. There is no history of Anesthesia problems.  ROS:   Please see the history of present illness.    All other systems reviewed and are negative.  EKGs/Labs/Other Studies Reviewed:    Echo 07/01/2019:  1. Left ventricular ejection fraction, by estimation, is 60 to 65%. The  left ventricle has normal function. The left ventricle has no regional  wall motion abnormalities. There is mild left ventricular hypertrophy.  Left ventricular diastolic parameters  are consistent with Grade I diastolic dysfunction (impaired relaxation).   2.  Right ventricular systolic function is normal. The right ventricular  size is normal.   3. The mitral valve is grossly normal. Trivial mitral valve  regurgitation.   4. The aortic valve is tricuspid. Aortic valve regurgitation is not  visualized.   5. The inferior vena cava is normal in size with greater than 50%  respiratory variability, suggesting right atrial pressure of 3 mmHg.  LHC 06/30/2019: New proximal total occlusion of the RCA and apical LAD.  The RCA fills by collaterals from septal perforators of the LAD.  The apical LAD is not collateralized. 80 to 85% mid LAD reduced to 0% with stenting using a 26 x 2.5 mm Onyx postdilated to 2.75 mm in diameter.  A large first diagonal contains 60 to 70% proximal narrowing Left main is widely patent The first obtuse marginal of the circumflex contains  60 to 70% proximal narrowing.  Circumflex is otherwise widely patent. Left ventriculography reveals inferobasal severe hypokinesis.  EF 40 to 50%.  LVEDP is normal.   RECOMMENDATIONS:   Aspirin and Brilinta x6 to 12 months. Medical therapy for residual angina.  If residual angina is significant, consider CTO of RCA. Track cardiac markers and kidney function. Aggressive risk factor modification.  Exercise Myoview 02/04/2015: Nuclear stress EF: 51%. Blood pressure demonstrated a hypertensive response to exercise. There was no ST segment deviation noted during stress. This is a low risk study. Findings consistent with ischemia.   Low risk stress nuclear study wtth a small, mild, reversible defect in the basal inferior lateral wall consistent with mild ischemia; EF 51 with normal wall motion; mild LVE.  Cardiac CT W/Score 09/03/2011: IMPRESSION:   1.  Multivessel coronary artery disease, as detailed above.  Findings are predominantly of nonobstructive disease, however,  there is a very short segment eccentric noncalcified plaque in the  distal LAD with a 50-75% stenosis. Correlation with  the stress  testing may be warranted to exclude a hemodynamically significant  lesion. The patient's total patient's total coronary artery calcium  score is 91 which is 84th percentile for patient's of matched age,  gender and race  2.  No other acute findings in the thorax to account for the  patient's symptoms.  3.  Small hiatal hernia.  4.  Nonspecific tiny pulmonary nodules in the left lung which are  subpleural in distribution and favored to represent small  subpleural lymph nodes. If the patient is at high risk for  bronchogenic carcinoma, follow-up chest CT at 1 year is  recommended.  If the patient is at low risk, no follow-up is  needed.  This recommendation follows the consensus statement:  Guidelines for Management of Small Pulmonary Nodules Detected on CT  Scans:  A Statement from the [place] as published in  Radiology 2005; 237:395-400.  5.  Codominance of the coronary arteries.    EKG:    EKG is personally reviewed and interpreted. 11/12/2020: Sinus rhythm. Rate 67 bpm.  Recent Labs: No results found for requested labs within last 8760 hours.   Recent Lipid Panel    Component Value Date/Time   CHOL 141 09/19/2019 0750   TRIG 116 09/19/2019 0750   HDL 48 09/19/2019 0750   CHOLHDL 2.9 09/19/2019 0750   CHOLHDL 4.3 06/30/2019 1252   VLDL 17 06/30/2019 1252   LDLCALC 72 09/19/2019 0750     Risk Assessment/Calculations:      Physical Exam:    VS:  BP 130/70 (BP Location: Left Arm, Patient Position: Sitting, Cuff Size: Normal)   Pulse 67   Ht 6' (1.829 m)   Wt 210 lb (95.3 kg)   SpO2 95%   BMI 28.48 kg/m     Wt Readings from Last 3 Encounters:  11/12/20 210 lb (95.3 kg)  04/27/20 218 lb (98.9 kg)  10/23/19 211 lb (95.7 kg)     GEN:  Well nourished, well developed in no acute distress HEENT: Normal NECK: No JVD; No carotid bruits LYMPHATICS: No lymphadenopathy CARDIAC: RRR, no murmurs, rubs, gallops RESPIRATORY:  Clear to auscultation  without rales, wheezing or rhonchi  ABDOMEN: Soft, non-tender, non-distended MUSCULOSKELETAL:  No edema; No deformity  SKIN: Warm and dry NEUROLOGIC:  Alert and oriented x 3 PSYCHIATRIC:  Normal affect   ASSESSMENT:    1. Coronary artery disease due to lipid rich plaque   2. Primary hypertension   3.  DM type 2, goal HbA1c < 7% (HCC)   4. Pure hypercholesterolemia     PLAN:    In order of problems listed above: CAD (coronary artery disease) LAD stent was placed in May 2021.  He is now off of the Brilinta.  He is on aspirin monotherapy.  No anginal symptoms.  Continue with isosorbide, carvedilol dosing reviewed as above.  Hypertension Excellent control.  No changes made.  Continue with current medication management.  DM type 2, goal HbA1c < 7% (HCC) Great job with hemoglobin A1c of 6.3.  8 pound weight loss.  Decrease carbohydrates.  Wonderful.  Hyperlipidemia Continuing with both Zetia 10 mg as well as atorvastatin 80 mg.  Last LDL 40.  Excellent.  No myalgias.      Follow-up: 1 year.  Medication Adjustments/Labs and Tests Ordered: Current medicines are reviewed at length with the patient today.  Concerns regarding medicines are outlined above.   Orders Placed This Encounter  Procedures   EKG 12-Lead    No orders of the defined types were placed in this encounter.  Patient Instructions  Medication Instructions:  The current medical regimen is effective;  continue present plan and medications.  *If you need a refill on your cardiac medications before your next appointment, please call your pharmacy*  Follow-Up: At [facility], you and your health needs are our priority.  As part of our continuing mission to provide you with exceptional heart care, we have created designated Provider Care Teams.  These Care Teams include your primary Cardiologist (physician) and Advanced Practice Providers (APPs -  Physician Assistants and Nurse Practitioners) who all work together  to provide you with the care you need, when you need it.  We recommend signing up for the patient portal called "MyChart".  Sign up information is provided on this After Visit Summary.  MyChart is used to connect with patients for Virtual Visits (Telemedicine).  Patients are able to view lab/test results, encounter notes, upcoming appointments, etc.  Non-urgent messages can be sent to your provider as well.   To learn more about what you can do with MyChart, go to [email or web address].    Your next appointment:   1 year(s)  The format for your next appointment:   In Person  Provider:   Candee Furbish, MD   Thank you for choosing [facility]!!     I,Mathew Stumpf,acting as a scribe for Candee Furbish, MD.,have documented all relevant documentation on the behalf of Candee Furbish, MD,as directed by  Candee Furbish, MD while in the presence of Candee Furbish, MD.  I, Candee Furbish, MD, have reviewed all documentation for this visit. The documentation on 11/12/20 for the exam, diagnosis, procedures, and orders are all accurate and complete.   Signed, Candee Furbish, MD  11/12/2020 4:48 PM    [place]

## 2020-12-23 ENCOUNTER — Other Ambulatory Visit: Payer: Self-pay | Admitting: Cardiology

## 2021-03-24 ENCOUNTER — Telehealth: Payer: Self-pay | Admitting: *Deleted

## 2021-03-24 NOTE — Telephone Encounter (Signed)
Dr. Marlou Porch we received request for surgical clearance for Mr. Zachary Hamilton with request for holding his aspirin for 5 days.  He had a mid LAD stent placed 5/21 and chronic total occlusion of RCA filled with collaterals.I  contacted the patient and he is able to do 4 METS of activity and revealed that he walks 2 miles a day without any difficulty.  He denies chest pain and shortness of breath.  Please send a reply and recommendations for holding aspirin to PT  CV  DIV (preop pool).

## 2021-03-24 NOTE — Telephone Encounter (Signed)
° °  Pre-operative Risk Assessment    Patient Name: Zachary Hamilton  DOB: Dec 13, 1960 MRN: [id number]      Request for Surgical Clearance    Procedure:   RIGHT SHOULDER SCOPE WITH ROTATOR CUFF REPAIR  Date of Surgery:  Clearance TBD                                 Surgeon:  DR. DANIEL CAFFREY Surgeon's Group or Practice Name:  [facility] Phone number:  [phone] EXT [address] Fax number:  [phone] ATTN: KELLY HIGH   Type of Clearance Requested:   - Medical  - Pharmacy:  Hold Aspirin     Type of Anesthesia:   CHOICE   Additional requests/questions:    [name]   03/24/2021, 10:25 AM

## 2021-03-31 NOTE — Telephone Encounter (Signed)
° °  Name: Zachary Hamilton  DOB: 31-Jan-1961  MRN: [id number]   Primary Cardiologist: Candee Furbish, MD  Chart reviewed as part of pre-operative protocol coverage. Patient was contacted 03/31/2021 in reference to pre-operative risk assessment for pending surgery as outlined below.  Zachary Hamilton was last seen on 11/12/20 by Dr. Marlou Porch.  Since that day, Zachary Hamilton has done well. He is able to complete 4.0 METS without angina.   Per Dr. Marlou Porch, he may  hold ASA 5 days prior to shoulder surgery. Please restart after when safe.  Therefore, based on ACC/AHA guidelines, the patient would be at acceptable risk for the planned procedure without further cardiovascular testing.   The patient was advised that if he develops new symptoms prior to surgery to contact our office to arrange for a follow-up visit, and he verbalized understanding.  I will route this recommendation to the requesting party via Epic fax function and remove from pre-op pool. Please call with questions.  Tami Lin Jovi Zavadil, PA 03/31/2021, 11:26 AM

## 2021-05-14 HISTORY — PX: SHOULDER ARTHROSCOPY WITH ROTATOR CUFF REPAIR: SHX5685

## 2021-07-12 ENCOUNTER — Other Ambulatory Visit: Payer: Self-pay | Admitting: Urology

## 2021-07-12 DIAGNOSIS — R972 Elevated prostate specific antigen [PSA]: Secondary | ICD-10-CM

## 2021-07-30 ENCOUNTER — Ambulatory Visit
Admission: RE | Admit: 2021-07-30 | Discharge: 2021-07-30 | Disposition: A | Discharge status: Home / Self Care | Payer: BC Managed Care – PPO | Source: Ambulatory Visit | Attending: Urology | Admitting: Urology

## 2021-07-30 DIAGNOSIS — R972 Elevated prostate specific antigen [PSA]: Secondary | ICD-10-CM

## 2021-07-30 MED ORDER — GADOBENATE DIMEGLUMINE 529 MG/ML IV SOLN
20.0000 mL | Freq: Once | INTRAVENOUS | Status: AC | PRN
  Administered 2021-07-30: 20 mL via INTRAVENOUS

## 2021-09-13 DIAGNOSIS — C61 Malignant neoplasm of prostate: Secondary | ICD-10-CM

## 2021-09-13 HISTORY — DX: Malignant neoplasm of prostate: C61

## 2021-09-30 ENCOUNTER — Other Ambulatory Visit: Payer: Self-pay | Admitting: Cardiology

## 2021-09-30 ENCOUNTER — Other Ambulatory Visit: Payer: Self-pay

## 2021-09-30 DIAGNOSIS — J4520 Mild intermittent asthma, uncomplicated: Secondary | ICD-10-CM | POA: Insufficient documentation

## 2021-09-30 DIAGNOSIS — Z860100 Personal history of colon polyps, unspecified: Secondary | ICD-10-CM | POA: Insufficient documentation

## 2021-09-30 DIAGNOSIS — E559 Vitamin D deficiency, unspecified: Secondary | ICD-10-CM | POA: Insufficient documentation

## 2021-09-30 DIAGNOSIS — K449 Diaphragmatic hernia without obstruction or gangrene: Secondary | ICD-10-CM | POA: Insufficient documentation

## 2021-09-30 DIAGNOSIS — Z8249 Family history of ischemic heart disease and other diseases of the circulatory system: Secondary | ICD-10-CM | POA: Insufficient documentation

## 2021-11-07 NOTE — Progress Notes (Signed)
GU Location of Tumor / Histology: Prostate Ca  If Prostate Cancer, Gleason Score is (3 + 3) and PSA is (3.89 on 04/2021)  Biopsies      Past/Anticipated interventions by urology, if any:      Past/Anticipated interventions by medical oncology, if any:  NA  Weight changes, if any: No  IPPS:  1 SHIM:  17  Bowel/Bladder complaints, if any:  No  Nausea/Vomiting, if any: No  Pain issues, if any:  0/10  SAFETY ISSUES: Prior radiation? No Pacemaker/ICD? No Possible current pregnancy? Male Is the patient on methotrexate? No  Current Complaints / other details:

## 2021-11-11 ENCOUNTER — Other Ambulatory Visit: Payer: Self-pay

## 2021-11-11 ENCOUNTER — Ambulatory Visit
Admission: RE | Admit: 2021-11-11 | Discharge: 2021-11-11 | Disposition: A | Discharge status: Home / Self Care | Payer: BC Managed Care – PPO | Source: Ambulatory Visit | Attending: Radiation Oncology | Admitting: Radiation Oncology

## 2021-11-11 VITALS — Ht 72.0 in | Wt 223.0 lb

## 2021-11-11 DIAGNOSIS — C61 Malignant neoplasm of prostate: Secondary | ICD-10-CM | POA: Insufficient documentation

## 2021-11-11 NOTE — Progress Notes (Signed)
Radiation Oncology         [phone] ________________________________  Initial Outpatient Consultation  Name: [name] MRN: [id number]  Date: 11/11/2021  DOB: [birth year]  AJ:GOTL, Zachary Luo, MD  [name], MD   REFERRING PHYSICIAN: [name], MD  DIAGNOSIS: 61 y.o. gentleman with Stage T1c adenocarcinoma of the prostate with Gleason score of 3+4, and PSA of 3.89.    ICD-10-CM   1. Malignant neoplasm of prostate ([place])  C61       HISTORY OF PRESENT ILLNESS: Zachary Hamilton is a 61 y.o. male with a diagnosis of prostate cancer. He was noted to have a rising PSA at 3.89 on routine labs with his primary care physician, [name].  Prior PSA was 2.2 in 2022 and 1.4 in 2021. Accordingly, he was referred for evaluation in urology by [name] on 07/08/2021,  digital rectal examination was performed at that time revealing no nodularity or concerning findings.  A prostate MRI was performed on 07/30/2021 and showed a PI-RADS 4 lesion in the right posterior lateral mid gland apical with suspicion for extracapsular extension and neurovascular bundle involvement.  Therefore, the patient proceeded to MRI fusion transrectal ultrasound with 15 biopsies of the prostate on 09/20/2021.  The prostate volume measured 27.5 cc.  Out of 15 core biopsies, 7 were positive.  The maximum Gleason score was 3+4, and this was seen in right mid lateral.  Additionally, there was Gleason 3+3 in 3/3 samples from the MRI ROI as well as the right base, right base lateral and right apex lateral cores.  The patient reviewed the biopsy results with his urologist and he has kindly been referred today for discussion of potential radiation treatment options.   PREVIOUS RADIATION THERAPY: No  PAST MEDICAL HISTORY:  Past Medical History:  Diagnosis Date   Chronic kidney disease    Coronary artery disease    CTA 09/03/11 - 50-75% distal LAD, calcified prox LAD. NUC stress 09/05/11 - no flow limiting  dz.    Diabetes mellitus without complication (HCC)    NEW ONSET TYPE 2 PER PATIENT    Dysphagia    GERD (gastroesophageal reflux disease)    Hypercholesteremia       PAST SURGICAL HISTORY: Past Surgical History:  Procedure Laterality Date   CARDIAC CATHETERIZATION  06/30/2019   CORONARY STENT INTERVENTION N/A 06/30/2019   Procedure: CORONARY STENT INTERVENTION;  Surgeon: Belva Crome, MD;  Location: [facility];  Service: Cardiovascular;  Laterality: N/A;   ESOPHAGOGASTRODUODENOSCOPY  02/10/2011   Procedure: ESOPHAGOGASTRODUODENOSCOPY (EGD);  Surgeon: Jeryl Columbia, MD;  Location: [facility];  Service: Endoscopy;  Laterality: N/A;   EXCISION METACARPAL MASS Right 07/11/2018   Procedure: EXCISION CYST, DEBRIDEMENT DISTAL INTERPHALANGEAL JOINT RIGHT MIDDLE FINGER;  Surgeon: Daryll Brod, MD;  Location: [facility];  Service: Orthopedics;  Laterality: Right;   LEFT HEART CATH AND CORONARY ANGIOGRAPHY N/A 06/30/2019   Procedure: LEFT HEART CATH AND CORONARY ANGIOGRAPHY;  Surgeon: Belva Crome, MD;  Location: [facility];  Service: Cardiovascular;  Laterality: N/A;   LEFT HEART CATHETERIZATION WITH CORONARY ANGIOGRAM Bilateral 08/12/2012   Procedure: LEFT HEART CATHETERIZATION WITH CORONARY ANGIOGRAM;  Surgeon: Candee Furbish, MD;  Location: [facility];  Service: Cardiovascular;  Laterality: Bilateral;   NASAL SINUS SURGERY  2000   SAVORY DILATION  02/10/2011   Procedure: SAVORY DILATION;  Surgeon: Jeryl Columbia, MD;  Location: [facility];  Service: Endoscopy;  Laterality: N/A;  FAMILY HISTORY:  Family History  Problem Relation Age of Onset   Heart attack Father        x5, heavy smoker   Diabetes Mother    Breast cancer Sister    Anesthesia problems Neg Hx     SOCIAL HISTORY:  Social History   Socioeconomic History   Marital status: Married    Spouse name: Not on file   Number of children: Not on file   Years of education: Not on file   Highest  education level: Not on file  Occupational History   Not on file  Tobacco Use   Smoking status: Never   Smokeless tobacco: Never   Tobacco comments:    grew up with a smoker  Vaping Use   Vaping Use: Never used  Substance and Sexual Activity   Alcohol use: Yes    Alcohol/week: 1.0 standard drink of alcohol    Types: 1 Cans of beer per week   Drug use: No   Sexual activity: Not on file  Other Topics Concern   Not on file  Social History Narrative   Not on file   Social Determinants of Health   Financial Resource Strain: Not on file  Food Insecurity: Not on file  Transportation Needs: Not on file  Physical Activity: Not on file  Stress: Not on file  Social Connections: Not on file  Intimate Partner Violence: Not on file    ALLERGIES: Patient has no known allergies.  MEDICATIONS:  Current Outpatient Medications  Medication Sig Dispense Refill   aspirin EC 81 MG tablet Take 81 mg by mouth daily.     atorvastatin (LIPITOR) 80 MG tablet Take 1 tablet (80 mg total) by mouth daily. 30 tablet 6   carvedilol (COREG) 3.125 MG tablet Take 1 tablet (3.125 mg total) by mouth 2 (two) times daily with a meal. 60 tablet 6   Coenzyme Q10 300 MG CAPS Take 1 capsule by mouth daily.     ezetimibe (ZETIA) 10 MG tablet TAKE 1 TABLET BY MOUTH EVERY DAY 90 tablet 0   isosorbide mononitrate (IMDUR) 60 MG 24 hr tablet TAKE 1 TABLET BY MOUTH EVERY DAY 90 tablet 0   JARDIANCE 25 MG TABS tablet Take 25 mg by mouth daily.     losartan-hydrochlorothiazide (HYZAAR) 100-25 MG tablet TAKE 1 TABLET BY MOUTH EVERY DAY 90 tablet 1   omeprazole (PRILOSEC) 20 MG capsule Take 20 mg by mouth daily.     Accu-Chek Softclix Lancets lancets      blood glucose meter kit and supplies Dispense based on patient and insurance preference. Use up to four times daily as directed. (FOR ICD-10 E10.9, E11.9). 1 each 0   nitroGLYCERIN (NITROSTAT) 0.4 MG SL tablet Place 1 tablet (0.4 mg total) under the tongue every 5 (five)  minutes x 3 doses as needed for chest pain. 25 tablet 3   No current facility-administered medications for this encounter.    REVIEW OF SYSTEMS:  On review of systems, the patient reports that he is doing well overall. He denies any chest pain, shortness of breath, cough, fevers, chills, night sweats, unintended weight changes. He denies any bowel disturbances, and denies abdominal pain, nausea or vomiting. He denies any new musculoskeletal or joint aches or pains. His IPSS was 1, indicating mild urinary symptoms. His SHIM was 17, indicating he has mild erectile dysfunction. A complete review of systems is obtained and is otherwise negative.  PHYSICAL EXAM:  Wt Readings from Last 3  Encounters:  11/11/21 223 lb (101.2 kg)  11/12/20 210 lb (95.3 kg)  04/27/20 218 lb (98.9 kg)   Temp Readings from Last 3 Encounters:  07/01/19 98.8 F (37.1 C) (Oral)  07/11/18 98.7 F (37.1 C) (Oral)  08/12/12 97.8 F (36.6 C)   BP Readings from Last 3 Encounters:  11/12/20 130/70  04/27/20 114/70  10/23/19 120/70   Pulse Readings from Last 3 Encounters:  11/12/20 67  04/27/20 62  10/23/19 61   Pain Assessment Pain Score: 0-No pain/10  In general this is a well appearing Caucasian male in no acute distress. He's alert and oriented x4 and appropriate throughout the examination. Cardiopulmonary assessment is negative for acute distress, and he exhibits normal effort.    KPS = 100  100 - Normal; no complaints; no evidence of disease. 90   - Able to carry on normal activity; minor signs or symptoms of disease. 80   - Normal activity with effort; some signs or symptoms of disease. 33   - Cares for self; unable to carry on normal activity or to do active work. 60   - Requires occasional assistance, but is able to care for most of his personal needs. 50   - Requires considerable assistance and frequent medical care. 43   - Disabled; requires special care and assistance. 31   - Severely disabled;  hospital admission is indicated although death not imminent. 29   - Very sick; hospital admission necessary; active supportive treatment necessary. 10   - Moribund; fatal processes progressing rapidly. 0     - Dead  Karnofsky DA, Abelmann [place], Craver LS and Burchenal [facility] [phone]) The use of the nitrogen mustards in the palliative treatment of carcinoma: with particular reference to bronchogenic carcinoma Cancer 1 634-56  LABORATORY DATA:  Lab Results  Component Value Date   WBC 6.7 06/30/2019   HGB 15.1 06/30/2019   HCT 45.0 06/30/2019   MCV 89.3 06/30/2019   PLT 261 06/30/2019   Lab Results  Component Value Date   NA 136 07/01/2019   K 3.8 07/01/2019   CL 99 07/01/2019   CO2 25 07/01/2019   Lab Results  Component Value Date   ALT 20 09/19/2019   AST 18 09/19/2019   ALKPHOS 50 09/19/2019   BILITOT 0.5 09/19/2019     RADIOGRAPHY: No results found.    IMPRESSION/PLAN: 1. 61 y.o. gentleman with Stage T1c adenocarcinoma of the prostate with Gleason Score of 3+4, and PSA of 3.89. We discussed the patient's workup and outlined the nature of prostate cancer in this setting. The patient's T stage, Gleason's score, and PSA put him into the favorable intermediate risk group. Accordingly, he is eligible for a variety of potential treatment options including brachytherapy, 5.5 weeks of external radiation, or prostatectomy. We discussed the available radiation techniques, and focused on the details and logistics of delivery.  We discussed and outlined the risks, benefits, short and long-term effects associated with radiotherapy and compared and contrasted these with prostatectomy. We discussed the role of SpaceOAR gel in reducing the rectal toxicity associated with radiotherapy. He appears to have a good understanding of his disease and our treatment recommendations which are of curative intent.  He was encouraged to ask questions that were answered to his stated satisfaction.  At the conclusion  of our conversation, the patient is interested in moving forward with brachytherapy and use of SpaceOAR gel to reduce rectal toxicity from radiotherapy.  We will share our discussion with [name]  and move forward with scheduling his [facility] planning appointment in the near future.  The patient will be contacted by Romie Jumper in our office who will be working closely with him to coordinate OR scheduling and pre and post procedure appointments.  We will contact the pharmaceutical rep to ensure that Sac is available at the time of procedure.  We enjoyed meeting him today and look forward to continuing to participate in his care.   We personally spent 70 minutes in this encounter including chart review, reviewing radiological studies, meeting face-to-face with the patient, entering orders and completing documentation.    Nicholos Johns, PA-C    Tyler Pita, MD  [facility] Direct Dial: [phone]  Fax: [phone] [email or web address]  Skype  LinkedIn

## 2021-12-13 ENCOUNTER — Telehealth: Payer: Self-pay | Admitting: *Deleted

## 2021-12-13 NOTE — Telephone Encounter (Signed)
CALLED PATIENT TO REMIND OF PRE-SEED APPTS. FOR 12-15-21, SPOKE WITH PATIENT AND HE IS AWARE OF THESE APPTS.

## 2021-12-14 NOTE — Progress Notes (Signed)
  Radiation Oncology         [phone] ________________________________  Name: Zachary Hamilton MRN: [id number]  Date: 12/15/2021  DOB: 1960-05-11  SIMULATION AND TREATMENT PLANNING NOTE PUBIC ARCH STUDY  WI:OXBD, [name], MD  [name], MD  DIAGNOSIS: 61 y.o. gentleman with Stage T1c adenocarcinoma of the prostate with Gleason score of 3+4, and PSA of 3.89.   Oncology History  Malignant neoplasm of prostate ([place])  09/20/2021 Cancer Staging   Staging form: Prostate, AJCC 8th Edition - Clinical stage from 09/20/2021: Stage IIB (cT1c, cN0, cM0, PSA: 3.9, Grade Group: 2) - Signed by Freeman Caldron, PA-C on 11/11/2021 Histopathologic type: Adenocarcinoma, NOS Stage prefix: Initial diagnosis Prostate specific antigen (PSA) range: Less than 10 Gleason primary pattern: 3 Gleason secondary pattern: 4 Gleason score: 7 Histologic grading system: 5 grade system Number of biopsy cores examined: 15 Number of biopsy cores positive: 7 Location of positive needle core biopsies: One side   11/11/2021 Initial Diagnosis   Malignant neoplasm of prostate ([place])       ICD-10-CM   1. Malignant neoplasm of prostate ([place])  C61       COMPLEX SIMULATION:  The patient presented today for evaluation for possible prostate seed implant. He was brought to the radiation planning suite and placed supine on the CT couch. A 3-dimensional image study set was obtained in upload to the planning computer. There, on each axial slice, I contoured the prostate gland. Then, using three-dimensional radiation planning tools I reconstructed the prostate in view of the structures from the transperineal needle pathway to assess for possible pubic arch interference. In doing so, I did not appreciate any pubic arch interference. Also, the patient's prostate volume was estimated based on the drawn structure. The volume was 27 cc.  Given the pubic arch appearance and prostate volume, patient remains a good candidate to  proceed with prostate seed implant. Today, he freely provided informed written consent to proceed.    PLAN: The patient will undergo prostate seed implant.   ________________________________  Sheral Apley. Tammi Klippel, M.D.

## 2021-12-14 NOTE — Progress Notes (Signed)
  Radiation Oncology         [phone] ________________________________  Name: Zachary Hamilton MRN: [id number]  Date: 12/15/2021  DOB: 12-25-1960  SIMULATION AND TREATMENT PLANNING NOTE PUBIC ARCH STUDY  MV:[facility], [name], MD  [name], MD  DIAGNOSIS: 61 y.o. gentleman with Stage T1c adenocarcinoma of the prostate with Gleason score of 3+4, and PSA of 3.89.   Oncology History  Malignant neoplasm of prostate ([name])  09/20/2021 Cancer Staging   Staging form: Prostate, AJCC 8th Edition - Clinical stage from 09/20/2021: Stage IIB (cT1c, cN0, cM0, PSA: 3.9, Grade Group: 2) - Signed by Freeman Caldron, PA-C on 11/11/2021 Histopathologic type: Adenocarcinoma, NOS Stage prefix: Initial diagnosis Prostate specific antigen (PSA) range: Less than 10 Gleason primary pattern: 3 Gleason secondary pattern: 4 Gleason score: 7 Histologic grading system: 5 grade system Number of biopsy cores examined: 15 Number of biopsy cores positive: 7 Location of positive needle core biopsies: One side   11/11/2021 Initial Diagnosis   Malignant neoplasm of prostate ([name])       ICD-10-CM   1. Malignant neoplasm of prostate ([name])  C61       COMPLEX SIMULATION:  The patient presented today for evaluation for possible prostate seed implant. He was brought to the radiation planning suite and placed supine on the CT couch. A 3-dimensional image study set was obtained in upload to the planning computer. There, on each axial slice, I contoured the prostate gland. Then, using three-dimensional radiation planning tools I reconstructed the prostate in view of the structures from the transperineal needle pathway to assess for possible pubic arch interference. In doing so, I did not appreciate any pubic arch interference. Also, the patient's prostate volume was estimated based on the drawn structure. The volume was 27 cc.  Given the pubic arch appearance and prostate volume, patient remains a good candidate to  proceed with prostate seed implant. Today, he freely provided informed written consent to proceed.    PLAN: The patient will undergo prostate seed implant.   ________________________________  Sheral Apley. Tammi Klippel, M.D.

## 2021-12-14 NOTE — Progress Notes (Signed)
Radiation Oncology         [phone] ________________________________  Outpatient Follow up- Pre-seed visit  Name: Zachary Hamilton MRN: [id number]  Date: 12/15/2021  DOB: 12-17-1960  QQ:IWLN, Zachary Luo, MD  [name], MD   REFERRING PHYSICIAN: [name], MD  DIAGNOSIS: 61 y.o. gentleman with Stage T1c adenocarcinoma of the prostate with Gleason score of 3+4, and PSA of 3.89.     ICD-10-CM   1. Malignant neoplasm of prostate ([place])  C61       HISTORY OF PRESENT ILLNESS: Zachary Hamilton is a 61 y.o. male with a diagnosis of prostate cancer.  He was noted to have a rising PSA at 3.89 on routine labs with his primary care physician, [name].  Prior PSA was 2.2 in 2022 and 1.4 in 2021. Accordingly, he was referred for evaluation in urology by [name] on 07/08/2021,  digital rectal examination was performed at that time revealing no nodularity or concerning findings.  A prostate MRI was performed on 07/30/2021 and showed a PI-RADS 4 lesion in the right posterior lateral mid gland apical with suspicion for extracapsular extension and neurovascular bundle involvement.  Therefore, the patient proceeded to MRI fusion transrectal ultrasound with 15 biopsies of the prostate on 09/20/2021.  The prostate volume measured 27.5 cc.  Out of 15 core biopsies, 7 were positive.  The maximum Gleason score was 3+4, and this was seen in right mid lateral.  Additionally, there was Gleason 3+3 in 3/3 samples from the MRI ROI as well as the right base, right base lateral and right apex lateral cores.   The patient reviewed the biopsy results with his urologist and was kindly referred to [place] for discussion of potential radiation treatment options. We initially met the patient on 11/11/21 and he was most interested in proceeding with brachytherapy and SpaceOAR gel placement for treatment of his disease. He is here today for his pre-procedure imaging for planning and to answer any additional  questions he may have about this treatment.   PREVIOUS RADIATION THERAPY: No  PAST MEDICAL HISTORY:  Past Medical History:  Diagnosis Date   Chronic kidney disease    Coronary artery disease    CTA 09/03/11 - 50-75% distal LAD, calcified prox LAD. NUC stress 09/05/11 - no flow limiting dz.    Diabetes mellitus without complication (HCC)    NEW ONSET TYPE 2 PER PATIENT    Dysphagia    GERD (gastroesophageal reflux disease)    Hypercholesteremia       PAST SURGICAL HISTORY: Past Surgical History:  Procedure Laterality Date   CARDIAC CATHETERIZATION  06/30/2019   CORONARY STENT INTERVENTION N/A 06/30/2019   Procedure: CORONARY STENT INTERVENTION;  Surgeon: Belva Crome, MD;  Location: [facility];  Service: Cardiovascular;  Laterality: N/A;   ESOPHAGOGASTRODUODENOSCOPY  02/10/2011   Procedure: ESOPHAGOGASTRODUODENOSCOPY (EGD);  Surgeon: Jeryl Columbia, MD;  Location: [facility];  Service: Endoscopy;  Laterality: N/A;   EXCISION METACARPAL MASS Right 07/11/2018   Procedure: EXCISION CYST, DEBRIDEMENT DISTAL INTERPHALANGEAL JOINT RIGHT MIDDLE FINGER;  Surgeon: Daryll Brod, MD;  Location: [facility];  Service: Orthopedics;  Laterality: Right;   LEFT HEART CATH AND CORONARY ANGIOGRAPHY N/A 06/30/2019   Procedure: LEFT HEART CATH AND CORONARY ANGIOGRAPHY;  Surgeon: Belva Crome, MD;  Location: [facility];  Service: Cardiovascular;  Laterality: N/A;   LEFT HEART CATHETERIZATION WITH CORONARY ANGIOGRAM Bilateral 08/12/2012   Procedure: LEFT HEART CATHETERIZATION WITH CORONARY ANGIOGRAM;  Surgeon: Candee Furbish, MD;  Location: [facility];  Service: Cardiovascular;  Laterality: Bilateral;   NASAL SINUS SURGERY  2000   SAVORY DILATION  02/10/2011   Procedure: SAVORY DILATION;  Surgeon: Jeryl Columbia, MD;  Location: [facility];  Service: Endoscopy;  Laterality: N/A;    FAMILY HISTORY:  Family History  Problem Relation Age of Onset   Heart attack Father         x5, heavy smoker   Diabetes Mother    Breast cancer Sister    Anesthesia problems Neg Hx     SOCIAL HISTORY:  Social History   Socioeconomic History   Marital status: Married    Spouse name: Not on file   Number of children: Not on file   Years of education: Not on file   Highest education level: Not on file  Occupational History   Not on file  Tobacco Use   Smoking status: Never   Smokeless tobacco: Never   Tobacco comments:    grew up with a smoker  Vaping Use   Vaping Use: Never used  Substance and Sexual Activity   Alcohol use: Yes    Alcohol/week: 1.0 standard drink of alcohol    Types: 1 Cans of beer per week   Drug use: No   Sexual activity: Not on file  Other Topics Concern   Not on file  Social History Narrative   Not on file   Social Determinants of Health   Financial Resource Strain: Not on file  Food Insecurity: Not on file  Transportation Needs: Not on file  Physical Activity: Not on file  Stress: Not on file  Social Connections: Not on file  Intimate Partner Violence: Not on file    ALLERGIES: Patient has no known allergies.  MEDICATIONS:  Current Outpatient Medications  Medication Sig Dispense Refill   Accu-Chek Softclix Lancets lancets      aspirin EC 81 MG tablet Take 81 mg by mouth daily.     atorvastatin (LIPITOR) 80 MG tablet Take 1 tablet (80 mg total) by mouth daily. 30 tablet 6   blood glucose meter kit and supplies Dispense based on patient and insurance preference. Use up to four times daily as directed. (FOR ICD-10 E10.9, E11.9). 1 each 0   carvedilol (COREG) 3.125 MG tablet Take 1 tablet (3.125 mg total) by mouth 2 (two) times daily with a meal. 60 tablet 6   Coenzyme Q10 300 MG CAPS Take 1 capsule by mouth daily.     ezetimibe (ZETIA) 10 MG tablet TAKE 1 TABLET BY MOUTH EVERY DAY 90 tablet 0   isosorbide mononitrate (IMDUR) 60 MG 24 hr tablet TAKE 1 TABLET BY MOUTH EVERY DAY 90 tablet 0   JARDIANCE 25 MG TABS tablet Take 25 mg by  mouth daily.     losartan-hydrochlorothiazide (HYZAAR) 100-25 MG tablet TAKE 1 TABLET BY MOUTH EVERY DAY 90 tablet 1   nitroGLYCERIN (NITROSTAT) 0.4 MG SL tablet Place 1 tablet (0.4 mg total) under the tongue every 5 (five) minutes x 3 doses as needed for chest pain. 25 tablet 3   omeprazole (PRILOSEC) 20 MG capsule Take 20 mg by mouth daily.     No current facility-administered medications for this visit.    REVIEW OF SYSTEMS:  On review of systems, the patient reports that he is doing well overall. He denies any chest pain, shortness of breath, cough, fevers, chills, night sweats, unintended weight changes. He denies any bowel disturbances, and denies abdominal  pain, nausea or vomiting. He denies any new musculoskeletal or joint aches or pains. His IPSS was 1, indicating mild urinary symptoms. His SHIM was 17, indicating he has mild erectile dysfunction. A complete review of systems is obtained and is otherwise negative.     PHYSICAL EXAM:  Wt Readings from Last 3 Encounters:  11/11/21 223 lb (101.2 kg)  11/12/20 210 lb (95.3 kg)  04/27/20 218 lb (98.9 kg)   Temp Readings from Last 3 Encounters:  07/01/19 98.8 F (37.1 C) (Oral)  07/11/18 98.7 F (37.1 C) (Oral)  08/12/12 97.8 F (36.6 C)   BP Readings from Last 3 Encounters:  11/12/20 130/70  04/27/20 114/70  10/23/19 120/70   Pulse Readings from Last 3 Encounters:  11/12/20 67  04/27/20 62  10/23/19 61    /10  In general this is a well appearing Caucasian male in no acute distress. He's alert and oriented x4 and appropriate throughout the examination. Cardiopulmonary assessment is negative for acute distress, and he exhibits normal effort.     KPS = 100  100 - Normal; no complaints; no evidence of disease. 90   - Able to carry on normal activity; minor signs or symptoms of disease. 80   - Normal activity with effort; some signs or symptoms of disease. 36   - Cares for self; unable to carry on normal activity or to do  active work. 60   - Requires occasional assistance, but is able to care for most of his personal needs. 50   - Requires considerable assistance and frequent medical care. 48   - Disabled; requires special care and assistance. 80   - Severely disabled; hospital admission is indicated although death not imminent. 44   - Very sick; hospital admission necessary; active supportive treatment necessary. 10   - Moribund; fatal processes progressing rapidly. 0     - Dead  Karnofsky DA, Abelmann [place], Craver LS and Burchenal [facility] [phone]) The use of the nitrogen mustards in the palliative treatment of carcinoma: with particular reference to bronchogenic carcinoma Cancer 1 634-56  LABORATORY DATA:  Lab Results  Component Value Date   WBC 6.7 06/30/2019   HGB 15.1 06/30/2019   HCT 45.0 06/30/2019   MCV 89.3 06/30/2019   PLT 261 06/30/2019   Lab Results  Component Value Date   NA 136 07/01/2019   K 3.8 07/01/2019   CL 99 07/01/2019   CO2 25 07/01/2019   Lab Results  Component Value Date   ALT 20 09/19/2019   AST 18 09/19/2019   ALKPHOS 50 09/19/2019   BILITOT 0.5 09/19/2019     RADIOGRAPHY: No results found.    IMPRESSION/PLAN: 1. 61 y.o. gentleman with Stage T1c adenocarcinoma of the prostate with Gleason score of 3+4, and PSA of 3.89.   The patient has elected to proceed with seed implant for treatment of his disease. We reviewed the risks, benefits, short and long-term effects associated with brachytherapy and discussed the role of SpaceOAR in reducing the rectal toxicity associated with radiotherapy.  He appears to have a good understanding of his disease and our treatment recommendations which are of curative intent.  He was encouraged to ask questions that were answered to his stated satisfaction. He has freely signed written consent to proceed today in the office and a copy of this document will be placed in his medical record. His procedure will be scheduled in collaboration with Dr.  Louis Meckel and we will see him back for his post-procedure  visit approximately 3 weeks thereafter. We look forward to continuing to participate in his care. He knows that he is welcome to call with any questions or concerns at any time in the interim.  I personally spent 30 minutes in this encounter including chart review, reviewing radiological studies, meeting face-to-face with the patient, entering orders and completing documentation.    Nicholos Johns, MMS, PA-C [facility] [place]: [phone]  Fax: [phone]

## 2021-12-15 ENCOUNTER — Telehealth: Payer: Self-pay | Admitting: Cardiology

## 2021-12-15 ENCOUNTER — Ambulatory Visit
Admission: RE | Admit: 2021-12-15 | Discharge: 2021-12-15 | Disposition: A | Discharge status: Home / Self Care | Payer: BC Managed Care – PPO | Source: Ambulatory Visit | Attending: Urology | Admitting: Urology

## 2021-12-15 ENCOUNTER — Encounter: Payer: Self-pay | Admitting: Urology

## 2021-12-15 ENCOUNTER — Telehealth: Payer: Self-pay | Admitting: *Deleted

## 2021-12-15 ENCOUNTER — Other Ambulatory Visit (HOSPITAL_COMMUNITY): Payer: BC Managed Care – PPO

## 2021-12-15 ENCOUNTER — Other Ambulatory Visit: Payer: Self-pay | Admitting: Urology

## 2021-12-15 ENCOUNTER — Ambulatory Visit
Admission: RE | Admit: 2021-12-15 | Discharge: 2021-12-15 | Disposition: A | Discharge status: Home / Self Care | Payer: BC Managed Care – PPO | Source: Ambulatory Visit | Attending: Radiation Oncology | Admitting: Radiation Oncology

## 2021-12-15 VITALS — Resp 19 | Ht 72.0 in | Wt 230.0 lb

## 2021-12-15 DIAGNOSIS — C61 Malignant neoplasm of prostate: Secondary | ICD-10-CM | POA: Insufficient documentation

## 2021-12-15 NOTE — Telephone Encounter (Signed)
   Pre-operative Risk Assessment    Patient Name: Zachary Hamilton  DOB: [birth year] MRN: [id number]      Request for Surgical Clearance    Procedure:  Brachytherapy placement of space OAR  Date of Surgery:02-23-22    Clearance                                  Surgeon:  Dr Doralee Albino Surgeon's Group or Practice Name:   Phone number:  [phone] Fax number:  [phone]   Type of Clearance Requested:   - Medical  - Pharmacy:  Hold Aspirin     Type of Anesthesia:  General    Additional requests/questions:    SignedGlyn Ade   12/15/2021, 11:58 AM

## 2021-12-15 NOTE — Progress Notes (Signed)
Pre-seed nursing appointment for 61yrold male w/ Stage T1c adenocarcinoma of the prostate with Gleason score of 3+4, and PSA of 3.89.  I verified patient's identity and began nursing interview. Patient reports doing well. No issues at this time.  Meaningful use complete. No urinary management medications. [facility] Dr. HLouis Meckel Resp 19   Ht 6' (1.829 m)   Wt 230 lb (104.3 kg)   BMI 31.19 kg/m   This concludes the nursing interview.  MLeandra Kern LPN

## 2021-12-15 NOTE — Telephone Encounter (Signed)
RETURNED PATIENT'S PHONE CALL, SPOKE WITH PATIENT. ?

## 2021-12-15 NOTE — Telephone Encounter (Signed)
   Name: Zachary Hamilton  DOB: Aug 12, 1960  MRN: [id number]  Primary Cardiologist: Candee Furbish, MD  Chart reviewed as part of pre-operative protocol coverage. Because of Zachary Hamilton's past medical history and time since last visit, he will require a follow-up in-office visit in order to better assess preoperative cardiovascular risk.  Pre-op covering staff: - Please schedule appointment and call patient to inform them. If patient already had an upcoming appointment within acceptable timeframe, please add "pre-op clearance" to the appointment notes so provider is aware. - Please contact requesting surgeon's office via preferred method (i.e, phone, fax) to inform them of need for appointment prior to surgery.  Per office protocol, if patient is without any new symptoms or concerns at the time of their visit, he may hold Aspirin for 5-7 days prior to procedure.    Lenna Sciara, NP  12/15/2021, 12:05 PM

## 2021-12-15 NOTE — Telephone Encounter (Signed)
Patient is scheduled to see Christen Bame, NP on 12/20/21 for preop clearance.

## 2021-12-18 NOTE — Progress Notes (Unsigned)
Cardiology Office Note:    Date:  12/20/2021   ID:  Zachary Hamilton, DOB [birth year], MRN [id number]  PCP:  Lawerance Cruel, MD   [place] Prov[facility] Cardiologist:  Candee Furbish, MD     Referring MD: Lawerance Cruel, MD   Chief Complaint: Preoperative cardiac evaluation  History of Present Illness:    Zachary Hamilton is a very pleasant 61 y.o. male with a hx of CAD with CTO of RCA filled via collaterals, mid LAD stent placed 06/30/2019, HTN, HLD, type 2 DM, and recent diagnosis of prostate cancer.   Cardiac cath 2014 showed 50% mLAD wrapping around apex with mild OM and RCA disease. Presented with NSTEMI with troponin elevated to 2200, some ST elevation noted in III and aVL.  [place] 06/30/2019 revealed new proximal total occlusion of the RCA and apical LAD.  RCA fills with collaterals from septal perforators of the LAD, apical LAD not collateralized.  80 to 85% mid LAD reduced to 0% with stenting, a large first diagonal contains 60-70 proximal narrowing, LM widely patent.  First obtuse marginal of the circumflex contains 60 to 70% proximal narrowing, circumflex otherwise widely patent, inferior basal severe hypokinesis, EF 40 to 50%, normal LVEDP. 2D echo revealed normal LVEF 60-65%, G1DD, no significant valve disease.   Last cardiology clinic visit 11/12/20 with Dr. Marlou Porch at which time he reported recent intentional weight loss of 8 pounds. On aspirin monotherapy for CAD. He was overall doing well and was advised to return in 1 year.   He is here today for preoperative cardiac clearance for upcoming brachytherapy placement of space OAR.  Recent diagnosis of prostate cancer.  Otherwise has been feeling very well.  He walks 2 to 3 miles 5 times per week with a majority of his route being up inclines.  He denies chest pain, shortness of breath, palpitations. Feels heart rate speed up a few times per year up to HR 150s, has been happening for years. Apple watch reports sinus tachycardia.  States this occurs rarely and is not associated with any additional symptoms. No presyncope, syncope, orthopnea, PND, fatigue or bleeding concerns.   Past Medical History:  Diagnosis Date   Chronic kidney disease    Coronary artery disease    CTA 09/03/11 - 50-75% distal LAD, calcified prox LAD. NUC stress 09/05/11 - no flow limiting dz.    Diabetes mellitus without complication (HCC)    NEW ONSET TYPE 2 PER PATIENT    Dysphagia    GERD (gastroesophageal reflux disease)    Hypercholesteremia     Past Surgical History:  Procedure Laterality Date   CARDIAC CATHETERIZATION  06/30/2019   CORONARY STENT INTERVENTION N/A 06/30/2019   Procedure: CORONARY STENT INTERVENTION;  Surgeon: Belva Crome, MD;  Location: [facility];  Service: Cardiovascular;  Laterality: N/A;   ESOPHAGOGASTRODUODENOSCOPY  02/10/2011   Procedure: ESOPHAGOGASTRODUODENOSCOPY (EGD);  Surgeon: Jeryl Columbia, MD;  Location: [facility];  Service: Endoscopy;  Laterality: N/A;   EXCISION METACARPAL MASS Right 07/11/2018   Procedure: EXCISION CYST, DEBRIDEMENT DISTAL INTERPHALANGEAL JOINT RIGHT MIDDLE FINGER;  Surgeon: Daryll Brod, MD;  Location: [facility];  Service: Orthopedics;  Laterality: Right;   LEFT HEART CATH AND CORONARY ANGIOGRAPHY N/A 06/30/2019   Procedure: LEFT HEART CATH AND CORONARY ANGIOGRAPHY;  Surgeon: Belva Crome, MD;  Location: [facility];  Service: Cardiovascular;  Laterality: N/A;   LEFT HEART CATHETERIZATION WITH CORONARY ANGIOGRAM Bilateral 08/12/2012   Procedure: LEFT  HEART CATHETERIZATION WITH CORONARY ANGIOGRAM;  Surgeon: Candee Furbish, MD;  Location: [facility];  Service: Cardiovascular;  Laterality: Bilateral;   NASAL SINUS SURGERY  2000   SAVORY DILATION  02/10/2011   Procedure: SAVORY DILATION;  Surgeon: Jeryl Columbia, MD;  Location: [facility];  Service: Endoscopy;  Laterality: N/A;    Current Medications: Current Meds  Medication Sig   Accu-Chek Softclix  Lancets lancets    aspirin EC 81 MG tablet Take 81 mg by mouth daily.   atorvastatin (LIPITOR) 80 MG tablet Take 1 tablet (80 mg total) by mouth daily.   blood glucose meter kit and supplies Dispense based on patient and insurance preference. Use up to four times daily as directed. (FOR ICD-10 E10.9, E11.9).   carvedilol (COREG) 3.125 MG tablet Take 1 tablet (3.125 mg total) by mouth 2 (two) times daily with a meal.   Coenzyme Q10 300 MG CAPS Take 1 capsule by mouth daily.   ezetimibe (ZETIA) 10 MG tablet TAKE 1 TABLET BY MOUTH EVERY DAY   isosorbide mononitrate (IMDUR) 60 MG 24 hr tablet TAKE 1 TABLET BY MOUTH EVERY DAY   JARDIANCE 25 MG TABS tablet Take 25 mg by mouth daily.   losartan-hydrochlorothiazide (HYZAAR) 100-25 MG tablet TAKE 1 TABLET BY MOUTH EVERY DAY   nitroGLYCERIN (NITROSTAT) 0.4 MG SL tablet Place 1 tablet (0.4 mg total) under the tongue every 5 (five) minutes x 3 doses as needed for chest pain.   omeprazole (PRILOSEC) 20 MG capsule Take 20 mg by mouth daily.     Allergies:   Patient has no known allergies.   Social History   Socioeconomic History   Marital status: Married    Spouse name: Not on file   Number of children: Not on file   Years of education: Not on file   Highest education level: Not on file  Occupational History   Not on file  Tobacco Use   Smoking status: Never   Smokeless tobacco: Never   Tobacco comments:    grew up with a smoker  Vaping Use   Vaping Use: Never used  Substance and Sexual Activity   Alcohol use: Yes    Alcohol/week: 1.0 standard drink of alcohol    Types: 1 Cans of beer per week   Drug use: No   Sexual activity: Not on file  Other Topics Concern   Not on file  Social History Narrative   Not on file   Social Determinants of Health   Financial Resource Strain: Not on file  Food Insecurity: Not on file  Transportation Needs: Not on file  Physical Activity: Not on file  Stress: Not on file  Social Connections: Not on  file     Family History: The patient's family history includes Breast cancer in his sister; Diabetes in his mother; Heart attack in his father. There is no history of Anesthesia problems.  ROS:   Please see the history of present illness.   All other systems reviewed and are negative.  Labs/Other Studies Reviewed:    The following studies were reviewed today:  Echo 07/01/19  1. Left ventricular ejection fraction, by estimation, is 60 to 65%. The  left ventricle has normal function. The left ventricle has no regional  wall motion abnormalities. There is mild left ventricular hypertrophy.  Left ventricular diastolic parameters  are consistent with Grade I diastolic dysfunction (impaired relaxation).   2. Right ventricular systolic function is normal. The right ventricular  size is normal.  3. The mitral valve is grossly normal. Trivial mitral valve  regurgitation.   4. The aortic valve is tricuspid. Aortic valve regurgitation is not  visualized.   5. The inferior vena cava is normal in size with greater than 50%  respiratory variability, suggesting right atrial pressure of 3 mmHg.   LHC 06/30/19  New proximal total occlusion of the RCA and apical LAD.  The RCA fills by collaterals from septal perforators of the LAD.  The apical LAD is not collateralized. 80 to 85% mid LAD reduced to 0% with stenting using a 26 x 2.5 mm Onyx postdilated to 2.75 mm in diameter.  A large first diagonal contains 60 to 70% proximal narrowing Left main is widely patent The first obtuse marginal of the circumflex contains 60 to 70% proximal narrowing.  Circumflex is otherwise widely patent. Left ventriculography reveals inferobasal severe hypokinesis.  EF 40 to 50%.  LVEDP is normal.   RECOMMENDATIONS:   Aspirin and Brilinta x6 to 12 months. Medical therapy for residual angina.  If residual angina is significant, consider CTO of RCA. Track cardiac markers and kidney function. Aggressive risk factor  modification.  Diagnostic Dominance: Right   Recent Labs: No results found for requested labs within last 365 days.  Recent Lipid Panel    Component Value Date/Time   CHOL 141 09/19/2019 0750   TRIG 116 09/19/2019 0750   HDL 48 09/19/2019 0750   CHOLHDL 2.9 09/19/2019 0750   CHOLHDL 4.3 06/30/2019 1252   VLDL 17 06/30/2019 1252   LDLCALC 72 09/19/2019 0750     Risk Assessment/Calculations:         Physical Exam:    VS:  BP (!) 144/80   Pulse 65   Ht 6' (1.829 m)   Wt 234 lb 3.2 oz (106.2 kg)   SpO2 97%   BMI 31.76 kg/m     Wt Readings from Last 3 Encounters:  12/20/21 234 lb 3.2 oz (106.2 kg)  12/15/21 230 lb (104.3 kg)  11/11/21 223 lb (101.2 kg)     GEN:  Well nourished, well developed in no acute distress HEENT: Normal NECK: No JVD; No carotid bruits CARDIAC: RRR, no murmurs, rubs, gallops RESPIRATORY:  Clear to auscultation without rales, wheezing or rhonchi  ABDOMEN: Soft, non-tender, non-distended MUSCULOSKELETAL:  No edema; No deformity. 2+ pedal pulses, equal bilaterally SKIN: Warm and dry NEUROLOGIC:  Alert and oriented x 3 PSYCHIATRIC:  Normal affect   EKG:  EKG is ordered today.  The ekg ordered today demonstrates sinus rhythm at 62 bpm with first-degree AV block with PR 220 ms, with PACs, nonspecific TW abnormality   Diagnoses:    1. Pre-op evaluation   2. Coronary artery disease involving native coronary artery of native heart without angina pectoris   3. Hyperlipidemia LDL goal <70   4. Essential hypertension    Assessment and Plan:     Preoperative cardiac evaluation: The patient is doing well from a cardiac perspective. Therefore, based on ACC/AHA guidelines, the patient would be at acceptable risk for the planned procedure without further cardiovascular testing. According to the Revised Cardiac Risk Index (RCRI), his Perioperative Risk of Major Cardiac Event is (%): 6.6. His Functional Capacity in METs is: 7.59 according to the [facility]  Activity Status Index (DASI). He may hold aspirin for 5-7 days prior to procedure.  Clearance will be forwarded to requesting provider.  CAD without angina:  Proximal total occlusion of the RCA and apical LAD on LHC 06/2019, RCA fills  with collaterals from septal perforators of the LAD, apical LAD not collateralized. 80 to 85% mid LAD reduced to 0% with stenting, a large first diagonal contains 60-70 proximal narrowing, LM widely patent, first obtuse marginal of the circumflex contains 60 to 70% proximal narrowing, circumflex otherwise widely patent. He denies chest pain, dyspnea, or other symptoms concerning for angina. No indication for further ischemic evaluation at this time. Continue to achieve 150 minutes of moderate exercise daily. Heart healthy diet.   Hyperlipidemia LDL goal < 70: LDL 72 on 09/19/2019. No recent labs to review.  PCP has been monitoring lab work consistently.  Patient reports LDL has been well controlled.  Hypertension: BP slightly elevated today. We did not recheck while he was in the clinic.  Have contacted him and asked him to monitor at home and report consistent readings > 130 or > 80. He reported no recent concerns from other appointments.      Disposition: 1 year with Dr. Marlou Porch  Medication Adjustments/Labs and Tests Ordered: Current medicines are reviewed at length with the patient today.  Concerns regarding medicines are outlined above.  Orders Placed This Encounter  Procedures   EKG 12-Lead   No orders of the defined types were placed in this encounter.   Patient Instructions  Medication Instructions:   Your physician recommends that you continue on your current medications as directed. Please refer to the Current Medication list given to you today.   *If you need a refill on your cardiac medications before your next appointment, please call your pharmacy*   Lab Work:  None ordered.  If you have labs (blood work) drawn today and your tests are  completely normal, you will receive your results only by: [place] (if you have MyChart) OR A paper copy in the mail If you have any lab test that is abnormal or we need to change your treatment, we will call you to review the results.   Testing/Procedures:  None ordered.   Follow-Up: At [facility], you and your health needs are our priority.  As part of our continuing mission to provide you with exceptional heart care, we have created designated Provider Care Teams.  These Care Teams include your primary Cardiologist (physician) and Advanced Practice Providers (APPs -  Physician Assistants and Nurse Practitioners) who all work together to provide you with the care you need, when you need it.  We recommend signing up for the patient portal called "MyChart".  Sign up information is provided on this After Visit Summary.  MyChart is used to connect with patients for Virtual Visits (Telemedicine).  Patients are able to view lab/test results, encounter notes, upcoming appointments, etc.  Non-urgent messages can be sent to your provider as well.   To learn more about what you can do with MyChart, go to [email or web address].    Your next appointment:   1 year(s)  The format for your next appointment:   In Person  Provider:   Candee Furbish, MD     Other Instructions  Your physician wants you to follow-up in: 1 year with Dr. Marlou Porch.  You will receive a reminder letter in the mail two months in advance. If you don't receive a letter, please call our office to schedule the follow-up appointment.   Important Information About Sugar         Signed, Emmaline Life, NP  12/20/2021 5:53 PM    [place]

## 2021-12-20 ENCOUNTER — Encounter: Payer: Self-pay | Admitting: Nurse Practitioner

## 2021-12-20 ENCOUNTER — Ambulatory Visit: Payer: BC Managed Care – PPO | Attending: Nurse Practitioner | Admitting: Nurse Practitioner

## 2021-12-20 VITALS — BP 144/80 | HR 65 | Ht 72.0 in | Wt 234.2 lb

## 2021-12-20 DIAGNOSIS — E785 Hyperlipidemia, unspecified: Secondary | ICD-10-CM

## 2021-12-20 DIAGNOSIS — Z01818 Encounter for other preprocedural examination: Secondary | ICD-10-CM

## 2021-12-20 DIAGNOSIS — I10 Essential (primary) hypertension: Secondary | ICD-10-CM | POA: Diagnosis not present

## 2021-12-20 DIAGNOSIS — I2510 Atherosclerotic heart disease of native coronary artery without angina pectoris: Secondary | ICD-10-CM | POA: Diagnosis not present

## 2021-12-20 NOTE — Patient Instructions (Signed)
Medication Instructions:   Your physician recommends that you continue on your current medications as directed. Please refer to the Current Medication list given to you today.   *If you need a refill on your cardiac medications before your next appointment, please call your pharmacy*   Lab Work:  None ordered.  If you have labs (blood work) drawn today and your tests are completely normal, you will receive your results only by: [place] (if you have MyChart) OR A paper copy in the mail If you have any lab test that is abnormal or we need to change your treatment, we will call you to review the results.   Testing/Procedures:  None ordered.   Follow-Up: At [facility], you and your health needs are our priority.  As part of our continuing mission to provide you with exceptional heart care, we have created designated Provider Care Teams.  These Care Teams include your primary Cardiologist (physician) and Advanced Practice Providers (APPs -  Physician Assistants and Nurse Practitioners) who all work together to provide you with the care you need, when you need it.  We recommend signing up for the patient portal called "MyChart".  Sign up information is provided on this After Visit Summary.  MyChart is used to connect with patients for Virtual Visits (Telemedicine).  Patients are able to view lab/test results, encounter notes, upcoming appointments, etc.  Non-urgent messages can be sent to your provider as well.   To learn more about what you can do with MyChart, go to [email or web address].    Your next appointment:   1 year(s)  The format for your next appointment:   In Person  Provider:   Candee Furbish, MD     Other Instructions  Your physician wants you to follow-up in: 1 year with Dr. Marlou Porch.  You will receive a reminder letter in the mail two months in advance. If you don't receive a letter, please call our office to schedule the follow-up  appointment.   Important Information About Sugar

## 2021-12-29 ENCOUNTER — Other Ambulatory Visit: Payer: Self-pay | Admitting: Cardiology

## 2022-02-14 ENCOUNTER — Other Ambulatory Visit: Payer: Self-pay | Admitting: Urology

## 2022-02-21 ENCOUNTER — Encounter (HOSPITAL_BASED_OUTPATIENT_CLINIC_OR_DEPARTMENT_OTHER): Payer: Self-pay | Admitting: Urology

## 2022-02-21 NOTE — Progress Notes (Signed)
Spoke w/ via phone for pre-op interview--- pt Lab needs dos---- [employer] results------ current EKG in epic/ chart COVID test -----patient states asymptomatic no test needed Arrive at ------- 1115 on 02-23-2022 NPO after MN NO Solid Food.  Clear liquids from MN until--- 1015 Med rec completed Medications to take morning of surgery -----  coreg, lipitor, zetia, imdur, prilosec Diabetic medication ----- do not take jardiance morning of surgery Patient instructed no nail polish to be worn day of surgery Patient instructed to bring photo id and insurance card day of surgery Patient aware to have Driver (ride ) / caregiver    for 24 hours after surgery -- wife, patricia Patient Special Instructions ----- will do fleet enema morning of surgery Pre-Op special Istructions ----- pt has  cardiac pre-op office visit clearance note by Christen Bame NP on 12-20-2021 in epic/ chart Patient verbalized understanding of instructions that were given at this phone interview. Patient denies shortness of breath, chest pain, fever, cough at this phone interview.   Anesthesia Review:  HTN:  CAD hx NSTEMI 06/ 2014 no intervention and  06-30-2019 s/p PCI with DES to mLAD / CTO of RCA filled via collaterals;  DM2 Pt denies cardiac s&s/ sob.  Stated walks 2-3 miles 5 days per week  PCP: Dr C. Cristi Loron Cardiologist : Dr Marlou Porch (lov 12-20-2021) Chest x-ray :  EKG : 12-20-2021  Echo : 07-01-2019 Stress test:  09-05-2011 Cardiac Cath :  06-30-2019 Activity level:  denies sob w/ any activities Sleep Study/ CPAP : no/ no Fasting Blood Sugar :      / Checks Blood Sugar -- times a day:  weekly only not fasting Blood Thinner/ Instructions /Last Dose:  no ASA / Instructions/ Last Dose : ASA '81mg'$ /  per pt was given instructions from cardiology may stop 5-7 days prior, stated last dose 02-16-2022

## 2022-02-22 ENCOUNTER — Telehealth: Payer: Self-pay | Admitting: *Deleted

## 2022-02-22 NOTE — Telephone Encounter (Signed)
CALLED PATIENT TO REMIND OF PROCEDURE FOR 02-23-22, LVM FOR A RETURN CALL

## 2022-02-23 ENCOUNTER — Ambulatory Visit (HOSPITAL_COMMUNITY): Payer: BC Managed Care – PPO

## 2022-02-23 ENCOUNTER — Encounter (HOSPITAL_BASED_OUTPATIENT_CLINIC_OR_DEPARTMENT_OTHER)
Admission: RE | Disposition: A | Discharge status: Home / Self Care | Payer: Self-pay | Source: Ambulatory Visit | Attending: Urology

## 2022-02-23 ENCOUNTER — Ambulatory Visit (HOSPITAL_BASED_OUTPATIENT_CLINIC_OR_DEPARTMENT_OTHER)
Admission: RE | Admit: 2022-02-23 | Discharge: 2022-02-23 | Disposition: A | Discharge status: Home / Self Care | Payer: BC Managed Care – PPO | Source: Ambulatory Visit | Attending: Urology | Admitting: Urology

## 2022-02-23 ENCOUNTER — Ambulatory Visit (HOSPITAL_BASED_OUTPATIENT_CLINIC_OR_DEPARTMENT_OTHER): Payer: BC Managed Care – PPO | Admitting: Certified Registered Nurse Anesthetist

## 2022-02-23 ENCOUNTER — Encounter (HOSPITAL_BASED_OUTPATIENT_CLINIC_OR_DEPARTMENT_OTHER): Payer: Self-pay | Admitting: Urology

## 2022-02-23 ENCOUNTER — Other Ambulatory Visit: Payer: Self-pay

## 2022-02-23 DIAGNOSIS — Z955 Presence of coronary angioplasty implant and graft: Secondary | ICD-10-CM | POA: Insufficient documentation

## 2022-02-23 DIAGNOSIS — K219 Gastro-esophageal reflux disease without esophagitis: Secondary | ICD-10-CM | POA: Diagnosis not present

## 2022-02-23 DIAGNOSIS — C61 Malignant neoplasm of prostate: Secondary | ICD-10-CM | POA: Diagnosis present

## 2022-02-23 DIAGNOSIS — I252 Old myocardial infarction: Secondary | ICD-10-CM | POA: Insufficient documentation

## 2022-02-23 DIAGNOSIS — I25119 Atherosclerotic heart disease of native coronary artery with unspecified angina pectoris: Secondary | ICD-10-CM | POA: Diagnosis not present

## 2022-02-23 DIAGNOSIS — E119 Type 2 diabetes mellitus without complications: Secondary | ICD-10-CM | POA: Insufficient documentation

## 2022-02-23 DIAGNOSIS — I10 Essential (primary) hypertension: Secondary | ICD-10-CM | POA: Diagnosis not present

## 2022-02-23 DIAGNOSIS — Z01818 Encounter for other preprocedural examination: Secondary | ICD-10-CM

## 2022-02-23 HISTORY — DX: Nocturia: R35.1

## 2022-02-23 HISTORY — PX: RADIOACTIVE SEED IMPLANT: SHX5150

## 2022-02-23 HISTORY — DX: Type 2 diabetes mellitus without complications: E11.9

## 2022-02-23 HISTORY — DX: Essential (primary) hypertension: I10

## 2022-02-23 HISTORY — DX: Presence of spectacles and contact lenses: Z97.3

## 2022-02-23 HISTORY — PX: CYSTOSCOPY: SHX5120

## 2022-02-23 HISTORY — DX: Mixed hyperlipidemia: E78.2

## 2022-02-23 HISTORY — PX: SPACE OAR INSTILLATION: SHX6769

## 2022-02-23 LAB — POCT I-STAT, CHEM 8
BUN: 17 mg/dL (ref 6–20)
Calcium, Ion: 1.22 mmol/L (ref 1.15–1.40)
Chloride: 101 mmol/L (ref 98–111)
Creatinine, Ser: 0.8 mg/dL (ref 0.61–1.24)
Glucose, Bld: 154 mg/dL — ABNORMAL HIGH (ref 70–99)
HCT: 46 % (ref 39.0–52.0)
Hemoglobin: 15.6 g/dL (ref 13.0–17.0)
Potassium: 3.5 mmol/L (ref 3.5–5.1)
Sodium: 141 mmol/L (ref 135–145)
TCO2: 28 mmol/L (ref 22–32)

## 2022-02-23 LAB — GLUCOSE, CAPILLARY: Glucose-Capillary: 141 mg/dL — ABNORMAL HIGH (ref 70–99)

## 2022-02-23 SURGERY — INSERTION, RADIATION SOURCE, PROSTATE
Anesthesia: General | Site: Prostate

## 2022-02-23 MED ORDER — STERILE WATER FOR IRRIGATION IR SOLN
Status: DC | PRN
  Administered 2022-02-23: 3 mL

## 2022-02-23 MED ORDER — ONDANSETRON HCL 4 MG/2ML IJ SOLN
INTRAMUSCULAR | Status: DC | PRN
  Administered 2022-02-23: 4 mg via INTRAVENOUS

## 2022-02-23 MED ORDER — TAMSULOSIN HCL 0.4 MG PO CAPS: 0.4000 mg | ORAL_CAPSULE | Freq: Every day | ORAL | 2 refills | Status: AC

## 2022-02-23 MED ORDER — OXYCODONE HCL 5 MG PO TABS: 5.0000 mg | ORAL_TABLET | Freq: Once | ORAL | Status: DC | PRN

## 2022-02-23 MED ORDER — DEXAMETHASONE SODIUM PHOSPHATE 10 MG/ML IJ SOLN
INTRAMUSCULAR | Status: DC | PRN
  Administered 2022-02-23: 5 mg via INTRAVENOUS

## 2022-02-23 MED ORDER — MIDAZOLAM HCL 2 MG/2ML IJ SOLN
INTRAMUSCULAR | Status: AC
  Filled 2022-02-23: qty 2

## 2022-02-23 MED ORDER — CIPROFLOXACIN IN D5W 400 MG/200ML IV SOLN
400.0000 mg | INTRAVENOUS | Status: AC
  Administered 2022-02-23: 400 mg via INTRAVENOUS

## 2022-02-23 MED ORDER — SODIUM CHLORIDE (PF) 0.9 % IJ SOLN
INTRAMUSCULAR | Status: DC | PRN
  Administered 2022-02-23: 10 mL

## 2022-02-23 MED ORDER — OXYCODONE HCL 5 MG/5ML PO SOLN: 5.0000 mg | Freq: Once | ORAL | Status: DC | PRN

## 2022-02-23 MED ORDER — LIDOCAINE 2% (20 MG/ML) 5 ML SYRINGE
INTRAMUSCULAR | Status: DC | PRN
  Administered 2022-02-23: 80 mg via INTRAVENOUS

## 2022-02-23 MED ORDER — PROPOFOL 10 MG/ML IV BOLUS
INTRAVENOUS | Status: AC
  Filled 2022-02-23: qty 20

## 2022-02-23 MED ORDER — LACTATED RINGERS IV SOLN: INTRAVENOUS | Status: DC

## 2022-02-23 MED ORDER — FLEET ENEMA 7-19 GM/118ML RE ENEM: 1.0000 | ENEMA | Freq: Once | RECTAL | Status: DC

## 2022-02-23 MED ORDER — PROPOFOL 10 MG/ML IV BOLUS
INTRAVENOUS | Status: DC | PRN
  Administered 2022-02-23: 170 mg via INTRAVENOUS

## 2022-02-23 MED ORDER — FENTANYL CITRATE (PF) 100 MCG/2ML IJ SOLN
INTRAMUSCULAR | Status: AC
  Filled 2022-02-23: qty 2

## 2022-02-23 MED ORDER — FENTANYL CITRATE (PF) 100 MCG/2ML IJ SOLN: 25.0000 ug | INTRAMUSCULAR | Status: DC | PRN

## 2022-02-23 MED ORDER — IOHEXOL 300 MG/ML  SOLN
INTRAMUSCULAR | Status: DC | PRN
  Administered 2022-02-23: 7 mL

## 2022-02-23 MED ORDER — ACETAMINOPHEN 325 MG PO TABS: 325.0000 mg | ORAL_TABLET | ORAL | Status: DC | PRN

## 2022-02-23 MED ORDER — TRAMADOL HCL 50 MG PO TABS: 50.0000 mg | ORAL_TABLET | Freq: Four times a day (QID) | ORAL | 0 refills | Status: AC | PRN

## 2022-02-23 MED ORDER — ROCURONIUM BROMIDE 10 MG/ML (PF) SYRINGE
PREFILLED_SYRINGE | INTRAVENOUS | Status: DC | PRN
  Administered 2022-02-23: 50 mg via INTRAVENOUS
  Administered 2022-02-23: 10 mg via INTRAVENOUS

## 2022-02-23 MED ORDER — FENTANYL CITRATE (PF) 250 MCG/5ML IJ SOLN
INTRAMUSCULAR | Status: DC | PRN
  Administered 2022-02-23 (×2): 25 ug via INTRAVENOUS
  Administered 2022-02-23: 50 ug via INTRAVENOUS

## 2022-02-23 MED ORDER — ACETAMINOPHEN 160 MG/5ML PO SOLN: 325.0000 mg | ORAL | Status: DC | PRN

## 2022-02-23 MED ORDER — SUGAMMADEX SODIUM 200 MG/2ML IV SOLN
INTRAVENOUS | Status: DC | PRN
  Administered 2022-02-23: 200 mg via INTRAVENOUS

## 2022-02-23 MED ORDER — CIPROFLOXACIN IN D5W 400 MG/200ML IV SOLN
INTRAVENOUS | Status: AC
  Filled 2022-02-23: qty 200

## 2022-02-23 MED ORDER — MIDAZOLAM HCL 2 MG/2ML IJ SOLN
INTRAMUSCULAR | Status: DC | PRN
  Administered 2022-02-23: 2 mg via INTRAVENOUS

## 2022-02-23 SURGICAL SUPPLY — 45 items
BAG DRN RND TRDRP ANRFLXCHMBR (UROLOGICAL SUPPLIES) ×2
BAG URINE DRAIN 2000ML AR STRL (UROLOGICAL SUPPLIES) ×2 IMPLANT
BLADE CLIPPER SENSICLIP SURGIC (BLADE) ×2 IMPLANT
Bard QuickLink Cartridges with BrachySource I-125 IMPLANT
CATH FOLEY 2WAY SLVR  5CC 16FR (CATHETERS) ×2
CATH FOLEY 2WAY SLVR 5CC 16FR (CATHETERS) ×2 IMPLANT
CATH ROBINSON RED A/P 16FR (CATHETERS) IMPLANT
CATH ROBINSON RED A/P 20FR (CATHETERS) ×2 IMPLANT
CLOTH BEACON ORANGE TIMEOUT ST (SAFETY) ×2 IMPLANT
CNTNR URN SCR LID CUP LEK RST (MISCELLANEOUS) ×2 IMPLANT
CONT SPEC 4OZ STRL OR WHT (MISCELLANEOUS) ×2
COVER BACK TABLE 60X90IN (DRAPES) ×2 IMPLANT
COVER MAYO STAND STRL (DRAPES) ×2 IMPLANT
DRSG TEGADERM 4X4.75 (GAUZE/BANDAGES/DRESSINGS) ×2 IMPLANT
DRSG TEGADERM 8X12 (GAUZE/BANDAGES/DRESSINGS) ×2 IMPLANT
GAUZE SPONGE 4X4 12PLY STRL LF (GAUZE/BANDAGES/DRESSINGS) IMPLANT
GEL ULTRASOUND 20GR AQUASONIC (MISCELLANEOUS) ×4 IMPLANT
GLOVE BIO SURGEON STRL SZ 6.5 (GLOVE) IMPLANT
GLOVE BIO SURGEON STRL SZ7.5 (GLOVE) ×4 IMPLANT
GLOVE BIO SURGEON STRL SZ8 (GLOVE) IMPLANT
GLOVE BIOGEL PI IND STRL 6.5 (GLOVE) IMPLANT
GLOVE SURG ORTHO 8.5 STRL (GLOVE) ×2 IMPLANT
GOWN STRL REUS W/TWL XL LVL3 (GOWN DISPOSABLE) ×2 IMPLANT
GRID BRACH TEMP 18GA 2.8X3X.75 (MISCELLANEOUS) ×2 IMPLANT
HOLDER FOLEY CATH W/STRAP (MISCELLANEOUS) ×2 IMPLANT
IMPL SPACEOAR VUE SYSTEM (Spacer) ×2 IMPLANT
IMPLANT SPACEOAR VUE SYSTEM (Spacer) ×2 IMPLANT
IV NS 1000ML (IV SOLUTION) ×4
IV NS 1000ML BAXH (IV SOLUTION) ×4 IMPLANT
KIT TURNOVER CYSTO (KITS) ×2 IMPLANT
NDL BRACHY 18G 5PK (NEEDLE) ×8 IMPLANT
NDL BRACHY 18G SINGLE (NEEDLE) IMPLANT
NDL PK MORGANSTERN STABILIZ (NEEDLE) ×2 IMPLANT
NEEDLE BRACHY 18G 5PK (NEEDLE) ×8 IMPLANT
NEEDLE BRACHY 18G SINGLE (NEEDLE) IMPLANT
NEEDLE PK MORGANSTERN STABILIZ (NEEDLE) ×2 IMPLANT
PACK CYSTO (CUSTOM PROCEDURE TRAY) ×2 IMPLANT
SHEATH ULTRASOUND LF (SHEATH) IMPLANT
SHEATH ULTRASOUND LTX NONSTRL (SHEATH) IMPLANT
SUT BONE WAX W31G (SUTURE) IMPLANT
SYR 10ML LL (SYRINGE) ×2 IMPLANT
TOWEL OR 17X26 10 PK STRL BLUE (TOWEL DISPOSABLE) ×2 IMPLANT
UNDERPAD 30X36 HEAVY ABSORB (UNDERPADS AND DIAPERS) ×4 IMPLANT
WATER STERILE IRR 3000ML UROMA (IV SOLUTION) IMPLANT
WATER STERILE IRR 500ML POUR (IV SOLUTION) ×2 IMPLANT

## 2022-02-23 NOTE — Anesthesia Procedure Notes (Signed)
Procedure Name: Intubation Date/Time: 02/23/2022 1:35 PM  Performed by: Clearnce Sorrel, CRNAPre-anesthesia Checklist: Patient identified, Emergency Drugs available, Suction available and Patient being monitored Patient Re-evaluated:Patient Re-evaluated prior to induction Oxygen Delivery Method: Circle System Utilized Preoxygenation: Pre-oxygenation with 100% oxygen Induction Type: IV induction Ventilation: Mask ventilation without difficulty Laryngoscope Size: Mac and 4 Grade View: Grade II Tube type: Oral Tube size: 7.5 mm Number of attempts: 1 Airway Equipment and Method: Stylet and Oral airway Placement Confirmation: ETT inserted through vocal cords under direct vision, positive ETCO2 and breath sounds checked- equal and bilateral Secured at: 23 cm Tube secured with: Tape Dental Injury: Teeth and Oropharynx as per pre-operative assessment

## 2022-02-23 NOTE — Progress Notes (Signed)
Radiation Oncology         [phone] ________________________________  Name: Zachary Hamilton MRN: [id number]  Date: 02/23/2022  DOB: 1960/08/24       Prostate Seed Implant  SW:FUXN, Zachary Luo, MD  No ref. provider found  DIAGNOSIS:  62 y.o. gentleman with Stage T1c adenocarcinoma of the prostate with Gleason score of 3+4, and PSA of 3.89.   Oncology History  Malignant neoplasm of prostate ([place])  09/20/2021 Cancer Staging   Staging form: Prostate, AJCC 8th Edition - Clinical stage from 09/20/2021: Stage IIB (cT1c, cN0, cM0, PSA: 3.9, Grade Group: 2) - Signed by Freeman Caldron, PA-C on 11/11/2021 Histopathologic type: Adenocarcinoma, NOS Stage prefix: Initial diagnosis Prostate specific antigen (PSA) range: Less than 10 Gleason primary pattern: 3 Gleason secondary pattern: 4 Gleason score: 7 Histologic grading system: 5 grade system Number of biopsy cores examined: 15 Number of biopsy cores positive: 7 Location of positive needle core biopsies: One side   11/11/2021 Initial Diagnosis   Malignant neoplasm of prostate ([place])       ICD-10-CM   1. Pre-op testing  Z01.818 CBG per Guidelines for Diabetes Management for Patients Undergoing Surgery ([facility], [facility], and [facility] only)    CBG per protocol    I-Stat, Chem 8 on day of surgery per protocol    CBG per Guidelines for Diabetes Management for Patients Undergoing Surgery ([facility], [facility], and [facility] only)    CBG per protocol    I-Stat, Chem 8 on day of surgery per protocol      PROCEDURE: Insertion of radioactive I-125 seeds into the prostate gland.  RADIATION DOSE: 145 Gy, definitive therapy.  TECHNIQUE: Zachary Hamilton was brought to the operating room with the urologist. He was placed in the dorsolithotomy position. He was catheterized and a rectal tube was inserted. The perineum was shaved, prepped and draped. The ultrasound probe was then introduced by me into the rectum to see the prostate gland.  TREATMENT DEVICE: I attached the needle  grid to the ultrasound probe stand and anchor needles were placed.  3D PLANNING: The prostate was imaged in 3D using a sagittal sweep of the prostate probe. These images were transferred to the planning computer. There, the prostate, urethra and rectum were defined on each axial reconstructed image. Then, the software created an optimized 3D plan and a few seed positions were adjusted. The quality of the plan was reviewed using [facility] information for the target and the following two organs at risk:  Urethra and Rectum.  Then the accepted plan was printed and handed off to the radiation therapist.  Under my supervision, the custom loading of the seeds and spacers was carried out using the quick loader.  These pre-loaded needles were then placed into the needle holder.Marland Kitchen  PROSTATE VOLUME STUDY:  Using transrectal ultrasound the volume of the prostate was verified to be 33.7 cc.  SPECIAL TREATMENT PROCEDURE/SUPERVISION AND HANDLING: The pre-loaded needles were then delivered by the urologist under sagittal guidance. A total of 17 needles were used to deposit 71 seeds in the prostate gland. The individual seed activity was 0.369 mCi.  SpaceOAR:  Yes  COMPLEX SIMULATION: At the end of the procedure, an anterior radiograph of the pelvis was obtained to document seed positioning and count. Cystoscopy was performed by the urologist to check the urethra and bladder.  MICRODOSIMETRY: At the end of the procedure, the patient was emitting 0.06 mR/hr at 1 meter. Accordingly, he was considered safe for hospital discharge.  PLAN:  The patient will return to the radiation oncology clinic for post implant CT dosimetry in three weeks.   ________________________________  Sheral Apley Tammi Klippel, M.D.

## 2022-02-23 NOTE — Transfer of Care (Signed)
Immediate Anesthesia Transfer of Care Note  Patient: Zachary Hamilton  Procedure(s) Performed: RADIOACTIVE SEED IMPLANT/BRACHYTHERAPY IMPLANT (Prostate) SPACE OAR INSTILLATION (Prostate) CYSTOSCOPY FLEXIBLE (Bladder)  Patient Location: PACU  Anesthesia Type:General  Level of Consciousness: awake, alert , and oriented  Airway & Oxygen Therapy: Patient Spontanous Breathing  Post-op Assessment: Report given to RN and Post -op Vital signs reviewed and stable  Post vital signs: Reviewed and stable  Last Vitals:  Vitals Value Taken Time  BP 151/92 02/23/22 1500  Temp 37 C 02/23/22 1500  Pulse 77 02/23/22 1503  Resp 20 02/23/22 1503  SpO2 92 % 02/23/22 1503  Vitals shown include unvalidated device data.  Last Pain:  Vitals:   02/23/22 1149  TempSrc: Oral  PainSc: 0-No pain      Patients Stated Pain Goal: 7 (62/70/35 0093)  Complications: No notable events documented.

## 2022-02-23 NOTE — Interval H&P Note (Signed)
History and Physical Interval Note:  02/23/2022 1:23 PM  Zachary Hamilton  has presented today for surgery, with the diagnosis of PROSTATE CANCER.  The various methods of treatment have been discussed with the patient and family. After consideration of risks, benefits and other options for treatment, the patient has consented to  Procedure(s) with comments: RADIOACTIVE SEED IMPLANT/BRACHYTHERAPY IMPLANT (N/A) - 90 MINUTES NEEDED FOR CASE SPACE OAR INSTILLATION (N/A) as a surgical intervention.  The patient's history has been reviewed, patient examined, no change in status, stable for surgery.  I have reviewed the patient's chart and labs.  Questions were answered to the patient's satisfaction.     [name]

## 2022-02-23 NOTE — Op Note (Signed)
Preoperative diagnosis:  Clinical stage TI C adenocarcinoma the prostate  Postoperative diagnosis:  Same  Procedure:  #1 I-125 prostate seed implantation  #2 cystourethroscopy #3 instillation of SpaceOAR biogel  Surgeon: Louis Meckel, M.D. Radiation Oncologist: Tyler Pita, M.D.  Anesthesia: Gen.   Indications: Patient  was diagnosed with clinical stage TI C prostate cancer. We had extensive discussion with him about treatment options versus. He elected to proceed with seed implantation. He underwent consultation my office as well as with Dr. Tyler Pita. He appeared to understand the advantages disadvantages potential risks of this treatment option. Full informed consent has been obtained. The patient is had preoperative ciprofloxacin. PAS compression boots were placed.   Technique and findings: Patient was brought the operating room where he had  successful induction of general anesthesia. He was placed in lithotomy position and prepped and draped in usual manner. Appropriate surgical timeout was performed. Radiation oncology department placed a transrectal ultrasound probe anchoring stand. Foley catheter with contrast in the balloon was inserted without difficulty. Anchoring needles were placed within the prostate.  Real-time contouring of the urethra prostate and rectum were performed and the dosing parameters were established. Targeted dose was 145 gray. We then came to the operating suite suite for placement of the needles. A second timeout was performed. All needle passage was done with real-time transrectal ultrasound guidance with the sagittal plane. A total of 17 needles were placed.  71 active seeds were implanted.  The brachytherapy template was then removed.    A site in the midline was selected on the perineum for placement of an 18 g needle with saline.  The needle was advanced above the rectum and below Denonvillier's fascia to the mid gland and confirmed to be in the  midline on transverse imaging.  One cc of saline was injected confirming appropriate expansion of this space.  A total of 5 cc of saline was then injected to open the space further bilaterally.  The saline syringe was then removed and the SpaceOAR hydrogel was injected with good distribution bilaterally.A Foley catheter was removed and flexible cystoscopy failed to show any seeds outside the prostate. The patient was brought to recovery room in stable condition.

## 2022-02-23 NOTE — Discharge Instructions (Addendum)
DISCHARGE INSTRUCTIONS FOR PROSTATE SEED IMPLANTATION  Diet Resume your usual diet when you return home. To keep your bowels moving easily and softly, drink prune, apple and cranberry juice at room temperature. You may also take a stool softener, such as Colace, which is available without prescription at local pharmacies. Daily activities No driving or heavy lifting for at least two days after the implant. No bike riding, horseback riding or riding lawn mowers for the first month after the implant. Any strenuous physical activity should be approved by your doctor before you resume it. Sexual relations You may resume sexual relations two weeks after the procedure. A condom should be used for the first two weeks. Your semen may be dark brown or black; this is normal and is related bleeding that may have occurred during the implant. Postoperative swelling Expect swelling and bruising of the scrotum and perineum (the area between the scrotum and anus). Both the swelling and the bruising should resolve in l or 2 weeks. Ice packs and over- the-counter medications such as Tylenol, Advil or Aleve may lessen your discomfort. Postoperative urination Most men experience burning on urination and/or urinary frequency. If this becomes bothersome, contact your Urologist.  Medication can be prescribed to relieve these problems.  It is normal to have some blood in your urine for a few days after the implant. Special instructions related to the seeds It is unlikely that you will pass an Iodine-125 seed in your urine. The seeds are silver in color and are about as large as a grain of rice. If you pass a seed, do not handle it with your fingers. Use a spoon to place it in an envelope or jar in place this in base occluded area such as the garage or basement for return to the radiation clinic at your convenience.  Contact your doctor for Temperature greater than 101 F Increasing pain Inability to urinate Follow-up   You should have follow up with your urologist and radiation oncologist about 3 weeks after the procedure. General information regarding Iodine seeds Iodine-125 is a low energy radioactive material. It is not deeply penetrating and loses energy at short distances. Your prostate will absorb the radiation. Objects that are touched or used by the patient do not become radioactive. Body wastes (urine and stool) or body fluids (saliva, tears, semen or blood) are not radioactive. The Nuclear Regulatory Commission [facility]) has determined that no radiation precautions are needed for patients undergoing Iodine-125 seed implantation. The [facility] states that such patients do not present a risk to the people around them, including young children and pregnant women. However, in keeping with the general principle that radiation exposure should be kept as low reasonably possible, we suggest the following: Children and pets should not sit on the patient's lap for the first two (2) weeks after the implant. Pregnant (or possibly pregnant) women should avoid prolonged, close contact with the patient for the first two (2) weeks after the implant. A distance of three (3) feet is acceptable. At a distance of three (3) feet, there is no limit to the length of time anyone can be with the patient.  Post Anesthesia Home Care Instructions  Activity: Get plenty of rest for the remainder of the day. A responsible adult should stay with you for 24 hours following the procedure.  For the next 24 hours, DO NOT: -Drive a car -Paediatric nurse -Drink alcoholic beverages -Take any medication unless instructed by your physician -Make any legal decisions or sign important  papers.  Meals: Start with liquid foods such as gelatin or soup. Progress to regular foods as tolerated. Avoid greasy, spicy, heavy foods. If nausea and/or vomiting occur, drink only clear liquids until the nausea and/or vomiting subsides. Call your physician if vomiting  continues.  Special Instructions/Symptoms: Your throat may feel dry or sore from the anesthesia or the breathing tube placed in your throat during surgery. If this causes discomfort, gargle with warm salt water. The discomfort should disappear within 24 hours.

## 2022-02-23 NOTE — Anesthesia Postprocedure Evaluation (Signed)
Anesthesia Post Note  Patient: Zachary Hamilton  Procedure(s) Performed: RADIOACTIVE SEED IMPLANT/BRACHYTHERAPY IMPLANT (Prostate) SPACE OAR INSTILLATION (Prostate) CYSTOSCOPY FLEXIBLE (Bladder)     Patient location during evaluation: PACU Anesthesia Type: General Level of consciousness: awake Pain management: pain level controlled Vital Signs Assessment: post-procedure vital signs reviewed and stable Respiratory status: spontaneous breathing, nonlabored ventilation and respiratory function stable Cardiovascular status: blood pressure returned to baseline and stable Postop Assessment: no apparent nausea or vomiting Anesthetic complications: no   No notable events documented.  Last Vitals:  Vitals:   02/23/22 1515 02/23/22 1524  BP: 137/72   Pulse: 79 72  Resp: 19 11  Temp:    SpO2: 95% 98%    Last Pain:  Vitals:   02/23/22 1524  TempSrc:   PainSc: 0-No pain                 Nilda Simmer

## 2022-02-23 NOTE — Anesthesia Preprocedure Evaluation (Addendum)
Anesthesia Evaluation  Patient identified by MRN, date of birth, ID band Patient awake    Reviewed: Allergy & Precautions, NPO status , Patient's Chart, lab work & pertinent test results  Airway Mallampati: II  TM Distance: >3 FB Neck ROM: Full    Dental no notable dental hx.    Pulmonary asthma    Pulmonary exam normal        Cardiovascular hypertension, Pt. on medications and Pt. on home beta blockers + angina  + CAD, + Past MI and + Cardiac Stents   Rhythm:Regular Rate:Normal     1. Left ventricular ejection fraction, by estimation, is 60 to 65%. The  left ventricle has normal function. The left ventricle has no regional  wall motion abnormalities. There is mild left ventricular hypertrophy.  Left ventricular diastolic parameters  are consistent with Grade I diastolic dysfunction (impaired relaxation).   2. Right ventricular systolic function is normal. The right ventricular  size is normal.   3. The mitral valve is grossly normal. Trivial mitral valve  regurgitation.   4. The aortic valve is tricuspid. Aortic valve regurgitation is not  visualized.   5. The inferior vena cava is normal in size with greater than 50%  respiratory variability, suggesting right atrial pressure of 3 mmHg.     Neuro/Psych negative neurological ROS  negative psych ROS   GI/Hepatic Neg liver ROS,GERD  Medicated,,  Endo/Other  diabetes, Type 2    Renal/GU    Prostate Ca    Musculoskeletal  (+) Arthritis , Osteoarthritis,    Abdominal Normal abdominal exam  (+)   Peds  Hematology   Anesthesia Other Findings   Reproductive/Obstetrics                             Anesthesia Physical Anesthesia Plan  ASA: 3  Anesthesia Plan: General   Post-op Pain Management:    Induction: Intravenous  PONV Risk Score and Plan: 2 and Ondansetron, Dexamethasone, Treatment may vary due to age or medical condition and  Midazolam  Airway Management Planned: Mask and LMA  Additional Equipment: None  Intra-op Plan:   Post-operative Plan: Extubation in OR  Informed Consent: I have reviewed the patients History and Physical, chart, labs and discussed the procedure including the risks, benefits and alternatives for the proposed anesthesia with the patient or authorized representative who has indicated his/her understanding and acceptance.     Dental advisory given  Plan Discussed with: CRNA  Anesthesia Plan Comments:        Anesthesia Quick Evaluation

## 2022-02-23 NOTE — H&P (Signed)
Zachary Hamilton presents today for pre-operative history and physical exam in anticipation of brachytherapy and space oar placement by [name] on 02/23/22. He is doing well and is without complaint.   Cardiac clearance obtained from Dr. Marlou Porch.   Zachary Hamilton denies F/C, HA, CP, SOB, N/V, diarrhea/constipation, back pain, flank pain, hematuria, and dysuria.     HX:   CC: Prostate cancer   62 year old otherwise reasonably healthy male presents today for further discussion of his new diagnosis of prostate cancer. This discovered as a result of an elevated PSA.  Prostate MRI, 6/23: PI-RADS 4 lesion in the right anterior apical region.  TRUS volume: 28.5  PSA history:  3/23: 3.89  9/22: 2.2  9/21: 1.4   Prostate biopsy 09/20/2021: ROI, 3+3 = 6 and 3/3 cores, 3+4 = 7 in 1 core in the right lateral mid. 3 additional Gleason 3+3 = 6 cores in the right lateral region and base.   SH IM: 22  IPSS: 6   The patient has not on insulin-dependent diabetes. He had a stent placed in 2021. He is not anticoagulated. He has no past surgical history. He has a retired Fish farm manager. He is married.     ALLERGIES: No Known Drug Allergies    MEDICATIONS: Omeprazole 20 mg capsule,delayed release  Aspirin Ec 81 mg tablet, delayed release  Atorvastatin Calcium 80 mg tablet  Carvedilol 3.125 mg tablet  Ezetimibe 10 mg tablet  Isosorbide Mononitrate Er 60 mg tablet, extended release 24 hr  Jardiance 25 mg tablet  Losartan-Hydrochlorothiazide 100 mg-25 mg tablet     GU PSH: Prostate Needle Biopsy - 09/20/2021       PSH Notes: Cardiac stent (2021)   NON-GU PSH: Rotator Cuff Surgery.., 2023 Surgical Pathology, Gross And Microscopic Examination For Prostate Needle - 09/20/2021     GU PMH: Prostate Cancer - 10/14/2021 Elevated PSA - 09/20/2021, - 07/08/2021    NON-GU PMH: Arthritis Diabetes Type 2 GERD Hypercholesterolemia Hypertension Myocardial Infarction    FAMILY HISTORY: Heart Attack -  Father heart failure - Mother   SOCIAL HISTORY: Marital Status: Married Preferred Language: English; Ethnicity: Not Hispanic Or Latino; Race: White Current Smoking Status: Patient has never smoked.   Tobacco Use Assessment Completed: Used Tobacco in last 30 days? Does not use smokeless tobacco. Does drink.  Does not use drugs. Drinks 2 caffeinated drinks per day. Has not had a blood transfusion. Patient's occupation Community education officer.     Notes: ETOH occasional beer/wine/liquor    REVIEW OF SYSTEMS:    GU Review Male:   Patient denies frequent urination, hard to postpone urination, burning/ pain with urination, get up at night to urinate, leakage of urine, stream starts and stops, trouble starting your stream, have to strain to urinate , erection problems, and penile pain.  Gastrointestinal (Upper):   Patient denies nausea, vomiting, and indigestion/ heartburn.  Gastrointestinal (Lower):   Patient denies diarrhea and constipation.  Constitutional:   Patient denies fever, night sweats, weight loss, and fatigue.  Skin:   Patient denies skin rash/ lesion and itching.  Eyes:   Patient denies blurred vision and double vision.  Ears/ Nose/ Throat:   Patient denies sore throat and sinus problems.  Hematologic/Lymphatic:   Patient denies swollen glands and easy bruising.  Cardiovascular:   Patient denies leg swelling and chest pains.  Respiratory:   Patient denies cough and shortness of breath.  Endocrine:   Patient denies excessive thirst.  Musculoskeletal:   Patient denies back pain and joint pain.  Neurological:   Patient denies headaches and dizziness.  Psychologic:   Patient denies depression and anxiety.   VITAL SIGNS:      01/31/2022 01:26 PM  Weight 230 lb / 104.33 kg  Height 72 in / 182.88 cm  BP 128/76 mmHg  Pulse 81 /min  BMI 31.2 kg/m   MULTI-SYSTEM PHYSICAL EXAMINATION:    Constitutional: Well-nourished. No physical deformities. Normally developed. Good grooming.  Neck:  Neck symmetrical, not swollen. Normal tracheal position.  Respiratory: Normal breath sounds. No labored breathing, no use of accessory muscles.   Cardiovascular: Regular rate and rhythm. No murmur, no gallop.   Lymphatic: No enlargement of neck, axillae, groin.  Skin: No paleness, no jaundice, no cyanosis. No lesion, no ulcer, no rash.  Neurologic / Psychiatric: Oriented to time, oriented to place, oriented to person. No depression, no anxiety, no agitation.  Gastrointestinal: No mass, no tenderness, no rigidity, obese abdomen.   Eyes: Normal conjunctivae. Normal eyelids.  Ears, Nose, Mouth, and Throat: Left ear no scars, no lesions, no masses. Right ear no scars, no lesions, no masses. Nose no scars, no lesions, no masses. Normal hearing. Normal lips.  Musculoskeletal: Normal gait and station of head and neck.     Complexity of Data:  Records Review:   Previous Patient Records  Urine Test Review:   Urinalysis   01/31/22  Urinalysis  Urine Appearance Clear   Urine Color Yellow   Urine Glucose 2+ mg/dL  Urine Bilirubin Neg mg/dL  Urine Ketones Neg mg/dL  Urine Specific Gravity 1.015   Urine Blood Neg ery/uL  Urine pH 5.5   Urine Protein Neg mg/dL  Urine Urobilinogen 0.2 mg/dL  Urine Nitrites Neg   Urine Leukocyte Esterase Neg leu/uL   PROCEDURES:          Urinalysis - 81003 Dipstick Dipstick Cont'd  Color: Yellow Bilirubin: Neg mg/dL  Appearance: Clear Ketones: Neg mg/dL  Specific Gravity: 1.015 Blood: Neg ery/uL  pH: 5.5 Protein: Neg mg/dL  Glucose: 2+ mg/dL Urobilinogen: 0.2 mg/dL    Nitrites: Neg    Leukocyte Esterase: Neg leu/uL    ASSESSMENT:      ICD-10 Details  1 GU:   Prostate Cancer - C61    PLAN:           Schedule Return Visit/Planned Activity: Keep Scheduled Appointment - Schedule Surgery          Document Letter(s):  Created for Patient: Clinical Summary         Notes:   There are no changes in the patients history or physical exam since last  evaluation by [name]. Zachary Hamilton is scheduled to undergo brachytherapy and space oar placement on 02/23/22.   All Zachary Hamilton's questions were answered to the best of my ability.   Code status discussed and confirmed with Zachary Hamilton.

## 2022-02-27 ENCOUNTER — Encounter (HOSPITAL_BASED_OUTPATIENT_CLINIC_OR_DEPARTMENT_OTHER): Payer: Self-pay | Admitting: Urology

## 2022-03-15 ENCOUNTER — Telehealth: Payer: Self-pay | Admitting: *Deleted

## 2022-03-15 NOTE — Progress Notes (Signed)
Radiation Oncology         [phone] ________________________________  Name: Zachary Hamilton MRN: [id number]  Date: 03/16/2022  DOB: September 18, 1960  Post-Seed Follow-Up Visit Note  CC: Daisy Floro, MD  Crist Fat, MD  Diagnosis:   62 y.o. gentleman with Stage T1c adenocarcinoma of the prostate with Gleason score of 3+4, and PSA of 3.89.      ICD-10-CM   1. Malignant neoplasm of prostate (HCC)  C61       Interval Since Last Radiation:  3 weeks 02/23/22:  Insertion of radioactive I-125 seeds into the prostate gland; 145 Gy, definitive therapy with placement of SpaceOAR gel.  Narrative:  The patient returns today for routine follow-up.  He is complaining of increased urinary frequency and urinary hesitation symptoms. He filled out a questionnaire regarding urinary function today providing and overall IPSS score of 14 characterizing his symptoms as moderate with nocturia x 5, frequency, hesitancy and mild dysuria at the start of the stream.  His pre-implant score was 1.  He is taking Flomax daily as prescribed and is completing a 5-day course of antibiotics prescribed by Dr. Mena Goes on 03/12/2022.  He denies any abdominal pain or bowel symptoms but has been having some left flank pain and rectal pressure.  He has a history of kidney stones and feels like the left flank pain feels similar although it also seems to radiate down to the left buttocks at times which is not usual.  He denies any gross hematuria, fever, chills or night sweats.  He does also have some decreased energy but attributes this to the nocturia 5-8 times per night.  At this point, his symptoms are tolerable and overall, he is pleased with his progress to date.  ALLERGIES:  has No Known Allergies.  Meds: Current Outpatient Medications  Medication Sig Dispense Refill   Accu-Chek Softclix Lancets lancets      aspirin EC 81 MG tablet Take 81 mg by mouth at bedtime.     atorvastatin (LIPITOR) 80 MG tablet Take 1  tablet (80 mg total) by mouth daily. (Patient taking differently: Take 80 mg by mouth daily.) 30 tablet 6   b complex vitamins capsule Take 1 capsule by mouth daily.     blood glucose meter kit and supplies Dispense based on patient and insurance preference. Use up to four times daily as directed. (FOR ICD-10 E10.9, E11.9). 1 each 0   carvedilol (COREG) 3.125 MG tablet Take 1 tablet (3.125 mg total) by mouth 2 (two) times daily with a meal. 60 tablet 6   Coenzyme Q10 300 MG CAPS Take 1 capsule by mouth daily.     ezetimibe (ZETIA) 10 MG tablet TAKE 1 TABLET BY MOUTH EVERY DAY (Patient taking differently: Take 10 mg by mouth daily.) 90 tablet 3   isosorbide mononitrate (IMDUR) 60 MG 24 hr tablet TAKE 1 TABLET BY MOUTH EVERY DAY (Patient taking differently: Take 60 mg by mouth daily.) 90 tablet 3   JARDIANCE 25 MG TABS tablet Take 25 mg by mouth daily.     losartan-hydrochlorothiazide (HYZAAR) 100-25 MG tablet TAKE 1 TABLET BY MOUTH EVERY DAY (Patient taking differently: Take 1 tablet by mouth daily.) 90 tablet 1   nitroGLYCERIN (NITROSTAT) 0.4 MG SL tablet Place 1 tablet (0.4 mg total) under the tongue every 5 (five) minutes x 3 doses as needed for chest pain. (Patient taking differently: Place 0.4 mg under the tongue every 5 (five) minutes x 3 doses as needed for  chest pain.) 25 tablet 3   omeprazole (PRILOSEC) 20 MG capsule Take 20 mg by mouth daily.     tamsulosin (FLOMAX) 0.4 MG CAPS capsule Take 1 capsule (0.4 mg total) by mouth daily. 30 capsule 2   traMADol (ULTRAM) 50 MG tablet Take 1-2 tablets (50-100 mg total) by mouth every 6 (six) hours as needed for moderate pain. 15 tablet 0   No current facility-administered medications for this visit.    Physical Findings: In general this is a well appearing Caucasian male in no acute distress. He's alert and oriented x4 and appropriate throughout the examination. Cardiopulmonary assessment is negative for acute distress and he exhibits normal  effort.   Lab Findings: Lab Results  Component Value Date   WBC 6.7 06/30/2019   HGB 15.6 02/23/2022   HCT 46.0 02/23/2022   MCV 89.3 06/30/2019   PLT 261 06/30/2019    Radiographic Findings:  Patient underwent CT imaging in our clinic for post implant dosimetry. The CT will be reviewed by Dr. Kathrynn Running to confirm there is an adequate distribution of radioactive seeds throughout the prostate gland and ensure that there are no seeds in or near the rectum.  We suspect the final radiation plan and dosimetry will show appropriate coverage of the prostate gland. He understands that we will call and inform him of any unexpected findings on further review of his imaging and dosimetry.  Impression/Plan: 62 y.o. gentleman with Stage T1c adenocarcinoma of the prostate with Gleason score of 3+4, and PSA of 3.89.   The patient is recovering from the effects of radiation. His urinary symptoms should gradually improve over the next 4-6 months. We talked about this today and he will start taking Advil 400 mg p.o. twice daily for the next 1 to 2 weeks to help with the inflammation associated with radiation prostatitis/proctitis. He is encouraged by his improvement already and is otherwise pleased with his outcome. We also talked about long-term follow-up for prostate cancer following seed implant. He understands that ongoing PSA determinations and digital rectal exams will help perform surveillance to rule out disease recurrence. He has a postprocedure follow up appointment scheduled with Wardell Honour, PA, at [facility] this afternoon and will be scheduled for posttreatment labs and a follow-up visit with Dr. Marlou Porch in April/May 2024. He understands what to expect with his PSA measures. Patient was also educated today about some of the long-term effects from radiation including a small risk for rectal bleeding and possibly erectile dysfunction. We talked about some of the general management approaches to these  potential complications. However, I did encourage the patient to contact our office or return at any point if he has questions or concerns related to his previous radiation and prostate cancer.    Marguarite Arbour, PA-C

## 2022-03-15 NOTE — Telephone Encounter (Signed)
CALLED PATIENT TO REMIND OF POST SEED APPTS. FOR 03-16-22, SPOKE WITH PATIENT AND HE IS AWARE OF THESE APPTS.

## 2022-03-15 NOTE — Progress Notes (Signed)
  Radiation Oncology         [phone] ________________________________  Name: Zachary Hamilton MRN: [id number]  Date: 03/16/2022  DOB: 1960-10-14  COMPLEX SIMULATION NOTE  NARRATIVE:  The patient was brought to the [facility] today following prostate seed implantation approximately one month ago.  Identity was confirmed.  All relevant records and images related to the planned course of therapy were reviewed.  Then, the patient was set-up supine.  CT images were obtained.  The CT images were loaded into the planning software.  Then the prostate and rectum were contoured.  Treatment planning then occurred.  The implanted iodine 125 seeds were identified by the physics staff for projection of radiation distribution  I have requested : 3D Simulation  I have requested a DVH of the following structures: Prostate and rectum.    ________________________________  Sheral Apley Tammi Klippel, M.D.

## 2022-03-16 ENCOUNTER — Ambulatory Visit
Admission: RE | Admit: 2022-03-16 | Discharge: 2022-03-16 | Disposition: A | Discharge status: Home / Self Care | Payer: BC Managed Care – PPO | Source: Ambulatory Visit | Attending: Urology | Admitting: Urology

## 2022-03-16 ENCOUNTER — Encounter: Payer: Self-pay | Admitting: *Deleted

## 2022-03-16 ENCOUNTER — Other Ambulatory Visit: Payer: Self-pay

## 2022-03-16 ENCOUNTER — Ambulatory Visit
Admission: RE | Admit: 2022-03-16 | Discharge: 2022-03-16 | Disposition: A | Discharge status: Home / Self Care | Payer: BC Managed Care – PPO | Source: Ambulatory Visit | Attending: Radiation Oncology | Admitting: Radiation Oncology

## 2022-03-16 ENCOUNTER — Encounter: Payer: Self-pay | Admitting: Urology

## 2022-03-16 VITALS — BP 148/84 | HR 62 | Temp 97.9°F | Resp 20 | Ht 72.0 in | Wt 231.0 lb

## 2022-03-16 DIAGNOSIS — K627 Radiation proctitis: Secondary | ICD-10-CM | POA: Insufficient documentation

## 2022-03-16 DIAGNOSIS — C61 Malignant neoplasm of prostate: Secondary | ICD-10-CM

## 2022-03-16 DIAGNOSIS — Z79899 Other long term (current) drug therapy: Secondary | ICD-10-CM | POA: Insufficient documentation

## 2022-03-16 DIAGNOSIS — Z923 Personal history of irradiation: Secondary | ICD-10-CM | POA: Insufficient documentation

## 2022-03-16 DIAGNOSIS — Z7982 Long term (current) use of aspirin: Secondary | ICD-10-CM | POA: Insufficient documentation

## 2022-03-16 NOTE — Progress Notes (Signed)
Post-seed nursing interview for a 62 y.o. gentleman with Stage T1c adenocarcinoma of the prostate with Gleason score of 3+4, and PSA of 3.89.  I verified patient's identity and began nursing interview. Patient reports groin discomfort, and dysuria 4/10. No other issues reported at this time.  Meaningful use complete. I-PSS score of 14-moderate. Tamsulosin as directed. Urology appt- 03/16/22 w/ [name] at [facility].  BP (!) 148/84 (BP Location: Right Arm, Patient Position: Sitting, Cuff Size: Large)   Pulse 62   Temp 97.9 F (36.6 C)   Resp 20   Ht 6' (1.829 m)   Wt 231 lb (104.8 kg)   SpO2 99%   BMI 31.33 kg/m    This concludes the interview.   Leandra Kern, LPN

## 2022-03-17 ENCOUNTER — Encounter: Payer: Self-pay | Admitting: Radiation Oncology

## 2022-03-17 DIAGNOSIS — C61 Malignant neoplasm of prostate: Secondary | ICD-10-CM | POA: Diagnosis not present

## 2022-03-17 NOTE — Progress Notes (Signed)
  Radiation Oncology         [phone] ________________________________  Name: Zachary Hamilton MRN: [id number]  Date: 03/17/2022  DOB: 01/28/1961  3D Planning Note   Prostate Brachytherapy Post-Implant Dosimetry  Diagnosis: 62 y.o. gentleman with Stage T1c adenocarcinoma of the prostate with Gleason score of 3+4, and PSA of 3.89.   Narrative: On a previous date, Zachary Hamilton returned following prostate seed implantation for post implant planning. He underwent CT scan complex simulation to delineate the three-dimensional structures of the pelvis and demonstrate the radiation distribution.  Since that time, the seed localization, and complex isodose planning with dose volume histograms have now been completed.  Results:   Prostate Coverage - The dose of radiation delivered to the 90% or more of the prostate gland (D90) was 95.15% of the prescription dose. This exceeds our goal of greater than 90%. Rectal Sparing - The volume of rectal tissue receiving the prescription dose or higher was 0.0 cc. This falls under our thresholds tolerance of 1.0 cc.  Impression: The prostate seed implant appears to show adequate target coverage and appropriate rectal sparing.  Plan:  The patient will continue to follow with urology for ongoing PSA determinations. I would anticipate a high likelihood for local tumor control with minimal risk for rectal morbidity.  ________________________________  Sheral Apley Tammi Klippel, M.D.

## 2022-04-04 ENCOUNTER — Inpatient Hospital Stay: Payer: BC Managed Care – PPO | Attending: Nurse Practitioner | Admitting: *Deleted

## 2022-04-04 DIAGNOSIS — C61 Malignant neoplasm of prostate: Secondary | ICD-10-CM

## 2022-04-04 NOTE — Progress Notes (Signed)
2 Identifiers used for verification purposes. No vitals were taken as this was a telephone visit. Allergies and medications reviewed and updated. Pt denies pain today. Fatigue has resolved  Pt is sleeping fair despite getting up to bathroom 2-3x a night. Daytime urgency and frequency is resolving. No burning with urination. Bowels are fine. Pt is exercising by walking 2 miles a day and golfing as well. He is very interested in [place] , so I reviewed that information with him for the free classes. We discussed the " Nutrition Rainbow" and talked about ways to incorporate new vegetables into his diet. Pt will see urologist on May 6 for PSA labs. Last colonoscopy was

## 2022-12-21 ENCOUNTER — Other Ambulatory Visit (HOSPITAL_BASED_OUTPATIENT_CLINIC_OR_DEPARTMENT_OTHER): Payer: Self-pay

## 2022-12-21 MED ORDER — MOUNJARO 2.5 MG/0.5ML ~~LOC~~ SOAJ
2.5000 mg | SUBCUTANEOUS | 6 refills | Status: AC
  Filled 2022-12-21 – 2023-01-23 (×3): qty 2, 28d supply, fill #0

## 2022-12-25 ENCOUNTER — Other Ambulatory Visit (HOSPITAL_BASED_OUTPATIENT_CLINIC_OR_DEPARTMENT_OTHER): Payer: Self-pay

## 2022-12-25 MED ORDER — MOUNJARO 2.5 MG/0.5ML ~~LOC~~ SOAJ
2.5000 mg | SUBCUTANEOUS | 6 refills | Status: AC
  Filled 2022-12-25: qty 2, 28d supply, fill #0

## 2022-12-27 ENCOUNTER — Other Ambulatory Visit (HOSPITAL_BASED_OUTPATIENT_CLINIC_OR_DEPARTMENT_OTHER): Payer: Self-pay

## 2022-12-29 ENCOUNTER — Other Ambulatory Visit (HOSPITAL_BASED_OUTPATIENT_CLINIC_OR_DEPARTMENT_OTHER): Payer: Self-pay

## 2023-01-10 ENCOUNTER — Other Ambulatory Visit (HOSPITAL_BASED_OUTPATIENT_CLINIC_OR_DEPARTMENT_OTHER): Payer: Self-pay

## 2023-01-21 ENCOUNTER — Other Ambulatory Visit (HOSPITAL_BASED_OUTPATIENT_CLINIC_OR_DEPARTMENT_OTHER): Payer: Self-pay

## 2023-01-22 ENCOUNTER — Other Ambulatory Visit (HOSPITAL_BASED_OUTPATIENT_CLINIC_OR_DEPARTMENT_OTHER): Payer: Self-pay

## 2023-01-23 ENCOUNTER — Other Ambulatory Visit: Payer: Self-pay

## 2023-01-23 ENCOUNTER — Other Ambulatory Visit (HOSPITAL_BASED_OUTPATIENT_CLINIC_OR_DEPARTMENT_OTHER): Payer: Self-pay

## 2023-01-24 ENCOUNTER — Other Ambulatory Visit (HOSPITAL_BASED_OUTPATIENT_CLINIC_OR_DEPARTMENT_OTHER): Payer: Self-pay

## 2023-02-15 ENCOUNTER — Other Ambulatory Visit (HOSPITAL_BASED_OUTPATIENT_CLINIC_OR_DEPARTMENT_OTHER): Payer: Self-pay

## 2023-02-15 MED ORDER — MOUNJARO 5 MG/0.5ML ~~LOC~~ SOAJ
5.0000 mg | SUBCUTANEOUS | 0 refills | Status: AC
  Filled 2023-02-15 – 2023-02-17 (×3): qty 2, 28d supply, fill #0
  Filled 2023-03-13: qty 2, 28d supply, fill #1

## 2023-02-17 ENCOUNTER — Other Ambulatory Visit (HOSPITAL_BASED_OUTPATIENT_CLINIC_OR_DEPARTMENT_OTHER): Payer: Self-pay

## 2023-03-13 ENCOUNTER — Other Ambulatory Visit (HOSPITAL_BASED_OUTPATIENT_CLINIC_OR_DEPARTMENT_OTHER): Payer: Self-pay

## 2023-03-14 ENCOUNTER — Other Ambulatory Visit (HOSPITAL_BASED_OUTPATIENT_CLINIC_OR_DEPARTMENT_OTHER): Payer: Self-pay

## 2023-04-11 ENCOUNTER — Other Ambulatory Visit (HOSPITAL_BASED_OUTPATIENT_CLINIC_OR_DEPARTMENT_OTHER): Payer: Self-pay

## 2023-04-11 MED ORDER — MOUNJARO 7.5 MG/0.5ML ~~LOC~~ SOAJ
7.5000 mg | SUBCUTANEOUS | 2 refills | Status: AC
  Filled 2023-04-11: qty 2, 28d supply, fill #0
  Filled 2023-05-07: qty 2, 28d supply, fill #1
  Filled 2023-06-04: qty 2, 28d supply, fill #2

## 2023-06-21 ENCOUNTER — Other Ambulatory Visit (HOSPITAL_BASED_OUTPATIENT_CLINIC_OR_DEPARTMENT_OTHER): Payer: Self-pay

## 2023-06-21 MED ORDER — MOUNJARO 10 MG/0.5ML ~~LOC~~ SOAJ
10.0000 mg | SUBCUTANEOUS | 6 refills | Status: AC
  Filled 2023-06-21 – 2023-07-06 (×2): qty 2, 28d supply, fill #0

## 2023-07-06 ENCOUNTER — Other Ambulatory Visit (HOSPITAL_BASED_OUTPATIENT_CLINIC_OR_DEPARTMENT_OTHER): Payer: Self-pay

## 2023-07-30 ENCOUNTER — Other Ambulatory Visit (HOSPITAL_BASED_OUTPATIENT_CLINIC_OR_DEPARTMENT_OTHER): Payer: Self-pay

## 2023-07-30 MED ORDER — MOUNJARO 12.5 MG/0.5ML ~~LOC~~ SOAJ
12.5000 mg | SUBCUTANEOUS | 0 refills | Status: DC
  Filled 2023-07-30 – 2023-07-31 (×2): qty 6, 84d supply, fill #0

## 2023-07-31 ENCOUNTER — Other Ambulatory Visit (HOSPITAL_BASED_OUTPATIENT_CLINIC_OR_DEPARTMENT_OTHER): Payer: Self-pay

## 2023-10-22 ENCOUNTER — Other Ambulatory Visit: Payer: Self-pay

## 2023-10-22 ENCOUNTER — Other Ambulatory Visit (HOSPITAL_BASED_OUTPATIENT_CLINIC_OR_DEPARTMENT_OTHER): Payer: Self-pay

## 2023-10-22 MED ORDER — MOUNJARO 15 MG/0.5ML ~~LOC~~ SOAJ
15.0000 mg | SUBCUTANEOUS | 0 refills | Status: AC
  Filled 2023-10-22: qty 6, 84d supply, fill #0
